# Patient Record
Sex: Female | Born: 1960 | Race: White | Hispanic: No | Marital: Single | Smoking: Former smoker
Health system: Southern US, Community
[De-identification: ages and names within clinical notes are randomized; demographics above are authoritative.]

## PROBLEM LIST (undated history)

## (undated) DIAGNOSIS — F419 Anxiety disorder, unspecified: Secondary | ICD-10-CM

## (undated) DIAGNOSIS — K2270 Barrett's esophagus without dysplasia: Secondary | ICD-10-CM

## (undated) DIAGNOSIS — K219 Gastro-esophageal reflux disease without esophagitis: Secondary | ICD-10-CM

## (undated) DIAGNOSIS — R002 Palpitations: Secondary | ICD-10-CM

## (undated) DIAGNOSIS — K648 Other hemorrhoids: Secondary | ICD-10-CM

## (undated) DIAGNOSIS — K222 Esophageal obstruction: Secondary | ICD-10-CM

## (undated) DIAGNOSIS — C50919 Malignant neoplasm of unspecified site of unspecified female breast: Secondary | ICD-10-CM

## (undated) DIAGNOSIS — Z923 Personal history of irradiation: Secondary | ICD-10-CM

## (undated) DIAGNOSIS — E785 Hyperlipidemia, unspecified: Secondary | ICD-10-CM

## (undated) DIAGNOSIS — K589 Irritable bowel syndrome without diarrhea: Secondary | ICD-10-CM

## (undated) DIAGNOSIS — R0789 Other chest pain: Secondary | ICD-10-CM

## (undated) DIAGNOSIS — M79602 Pain in left arm: Secondary | ICD-10-CM

## (undated) HISTORY — PX: ESOPHAGOGASTRODUODENOSCOPY: SHX1529

## (undated) HISTORY — DX: Other chest pain: R07.89

## (undated) HISTORY — DX: Barrett's esophagus without dysplasia: K22.70

## (undated) HISTORY — DX: Hyperlipidemia, unspecified: E78.5

## (undated) HISTORY — DX: Anxiety disorder, unspecified: F41.9

## (undated) HISTORY — DX: Esophageal obstruction: K22.2

## (undated) HISTORY — DX: Other hemorrhoids: K64.8

## (undated) HISTORY — DX: Irritable bowel syndrome, unspecified: K58.9

## (undated) HISTORY — DX: Gastro-esophageal reflux disease without esophagitis: K21.9

## (undated) HISTORY — DX: Malignant neoplasm of unspecified site of unspecified female breast: C50.919

## (undated) HISTORY — DX: Pain in left arm: M79.602

## (undated) HISTORY — PX: OVARIAN CYST REMOVAL: SHX89

## (undated) HISTORY — DX: Palpitations: R00.2

---

## 2000-04-12 ENCOUNTER — Other Ambulatory Visit: Admission: RE | Admit: 2000-04-12 | Discharge: 2000-04-12 | Payer: Self-pay | Admitting: Obstetrics and Gynecology

## 2000-04-14 ENCOUNTER — Encounter: Payer: Self-pay | Admitting: Obstetrics and Gynecology

## 2000-04-14 ENCOUNTER — Encounter: Admission: RE | Admit: 2000-04-14 | Discharge: 2000-04-14 | Payer: Self-pay | Admitting: Obstetrics and Gynecology

## 2001-05-04 ENCOUNTER — Other Ambulatory Visit: Admission: RE | Admit: 2001-05-04 | Discharge: 2001-05-04 | Payer: Self-pay | Admitting: Obstetrics and Gynecology

## 2001-09-08 ENCOUNTER — Encounter: Admission: RE | Admit: 2001-09-08 | Discharge: 2001-09-08 | Payer: Self-pay | Admitting: Family Medicine

## 2001-09-08 ENCOUNTER — Encounter: Payer: Self-pay | Admitting: Family Medicine

## 2002-05-17 ENCOUNTER — Other Ambulatory Visit: Admission: RE | Admit: 2002-05-17 | Discharge: 2002-05-17 | Payer: Self-pay | Admitting: Obstetrics and Gynecology

## 2003-03-28 ENCOUNTER — Encounter: Admission: RE | Admit: 2003-03-28 | Discharge: 2003-03-28 | Payer: Self-pay | Admitting: Obstetrics and Gynecology

## 2003-03-28 ENCOUNTER — Encounter: Payer: Self-pay | Admitting: Obstetrics and Gynecology

## 2003-05-24 ENCOUNTER — Other Ambulatory Visit: Admission: RE | Admit: 2003-05-24 | Discharge: 2003-05-24 | Payer: Self-pay | Admitting: Obstetrics and Gynecology

## 2004-01-10 HISTORY — PX: COLONOSCOPY: SHX174

## 2004-01-27 ENCOUNTER — Emergency Department (HOSPITAL_COMMUNITY): Admission: EM | Admit: 2004-01-27 | Discharge: 2004-01-27 | Payer: Self-pay | Admitting: Emergency Medicine

## 2004-02-03 ENCOUNTER — Ambulatory Visit (HOSPITAL_COMMUNITY): Admission: RE | Admit: 2004-02-03 | Discharge: 2004-02-03 | Payer: Self-pay | Admitting: Internal Medicine

## 2004-02-03 ENCOUNTER — Encounter (INDEPENDENT_AMBULATORY_CARE_PROVIDER_SITE_OTHER): Payer: Self-pay | Admitting: *Deleted

## 2004-04-29 ENCOUNTER — Encounter: Admission: RE | Admit: 2004-04-29 | Discharge: 2004-04-29 | Payer: Self-pay | Admitting: Obstetrics and Gynecology

## 2004-07-16 ENCOUNTER — Other Ambulatory Visit: Admission: RE | Admit: 2004-07-16 | Discharge: 2004-07-16 | Payer: Self-pay | Admitting: Obstetrics and Gynecology

## 2005-01-05 ENCOUNTER — Ambulatory Visit: Payer: Self-pay | Admitting: Internal Medicine

## 2005-01-28 ENCOUNTER — Ambulatory Visit: Payer: Self-pay | Admitting: Psychology

## 2005-02-02 ENCOUNTER — Ambulatory Visit: Payer: Self-pay | Admitting: Internal Medicine

## 2005-02-02 ENCOUNTER — Encounter (INDEPENDENT_AMBULATORY_CARE_PROVIDER_SITE_OTHER): Payer: Self-pay | Admitting: *Deleted

## 2005-02-09 ENCOUNTER — Ambulatory Visit: Payer: Self-pay | Admitting: Psychology

## 2005-02-25 ENCOUNTER — Ambulatory Visit: Payer: Self-pay | Admitting: Psychology

## 2005-03-09 ENCOUNTER — Ambulatory Visit: Payer: Self-pay | Admitting: Psychology

## 2005-03-09 ENCOUNTER — Ambulatory Visit: Payer: Self-pay | Admitting: Internal Medicine

## 2005-03-17 ENCOUNTER — Ambulatory Visit: Payer: Self-pay | Admitting: Psychology

## 2005-03-25 ENCOUNTER — Ambulatory Visit: Payer: Self-pay | Admitting: Psychology

## 2005-04-02 ENCOUNTER — Ambulatory Visit: Payer: Self-pay | Admitting: Internal Medicine

## 2005-04-07 ENCOUNTER — Ambulatory Visit: Payer: Self-pay | Admitting: Internal Medicine

## 2005-05-07 ENCOUNTER — Ambulatory Visit: Payer: Self-pay | Admitting: Psychology

## 2005-05-17 ENCOUNTER — Ambulatory Visit: Payer: Self-pay | Admitting: Psychology

## 2005-06-09 ENCOUNTER — Ambulatory Visit: Payer: Self-pay | Admitting: Psychology

## 2005-07-12 HISTORY — PX: BREAST LUMPECTOMY: SHX2

## 2005-07-12 HISTORY — PX: PORTACATH PLACEMENT: SHX2246

## 2005-08-02 ENCOUNTER — Other Ambulatory Visit: Admission: RE | Admit: 2005-08-02 | Discharge: 2005-08-02 | Payer: Self-pay | Admitting: Obstetrics and Gynecology

## 2005-08-06 ENCOUNTER — Ambulatory Visit: Payer: Self-pay | Admitting: Psychology

## 2005-10-11 ENCOUNTER — Ambulatory Visit: Payer: Self-pay | Admitting: Psychology

## 2005-10-27 ENCOUNTER — Ambulatory Visit: Payer: Self-pay | Admitting: Psychology

## 2005-11-25 ENCOUNTER — Ambulatory Visit: Payer: Self-pay | Admitting: Psychology

## 2005-12-14 ENCOUNTER — Ambulatory Visit: Payer: Self-pay | Admitting: Psychology

## 2005-12-27 ENCOUNTER — Ambulatory Visit: Payer: Self-pay | Admitting: Internal Medicine

## 2006-01-17 ENCOUNTER — Ambulatory Visit: Payer: Self-pay | Admitting: Psychology

## 2006-02-09 ENCOUNTER — Ambulatory Visit: Payer: Self-pay | Admitting: Psychology

## 2006-02-14 ENCOUNTER — Ambulatory Visit: Payer: Self-pay | Admitting: Psychology

## 2006-02-28 ENCOUNTER — Ambulatory Visit: Payer: Self-pay | Admitting: Psychology

## 2006-03-28 ENCOUNTER — Ambulatory Visit: Payer: Self-pay | Admitting: Psychology

## 2006-04-06 ENCOUNTER — Encounter: Admission: RE | Admit: 2006-04-06 | Discharge: 2006-04-06 | Payer: Self-pay | Admitting: Obstetrics and Gynecology

## 2006-04-06 ENCOUNTER — Encounter (INDEPENDENT_AMBULATORY_CARE_PROVIDER_SITE_OTHER): Payer: Self-pay | Admitting: *Deleted

## 2006-04-06 ENCOUNTER — Encounter (INDEPENDENT_AMBULATORY_CARE_PROVIDER_SITE_OTHER): Payer: Self-pay | Admitting: Diagnostic Radiology

## 2006-04-12 ENCOUNTER — Ambulatory Visit: Payer: Self-pay | Admitting: Psychology

## 2006-04-18 ENCOUNTER — Encounter: Admission: RE | Admit: 2006-04-18 | Discharge: 2006-04-18 | Payer: Self-pay | Admitting: General Surgery

## 2006-04-19 ENCOUNTER — Encounter: Admission: RE | Admit: 2006-04-19 | Discharge: 2006-04-19 | Payer: Self-pay | Admitting: General Surgery

## 2006-04-22 ENCOUNTER — Encounter (INDEPENDENT_AMBULATORY_CARE_PROVIDER_SITE_OTHER): Payer: Self-pay | Admitting: *Deleted

## 2006-04-22 ENCOUNTER — Ambulatory Visit (HOSPITAL_BASED_OUTPATIENT_CLINIC_OR_DEPARTMENT_OTHER): Admission: RE | Admit: 2006-04-22 | Discharge: 2006-04-22 | Payer: Self-pay | Admitting: General Surgery

## 2006-04-22 ENCOUNTER — Encounter: Admission: RE | Admit: 2006-04-22 | Discharge: 2006-04-22 | Payer: Self-pay | Admitting: General Surgery

## 2006-04-22 ENCOUNTER — Ambulatory Visit (HOSPITAL_COMMUNITY): Admission: RE | Admit: 2006-04-22 | Discharge: 2006-04-22 | Payer: Self-pay | Admitting: General Surgery

## 2006-04-25 ENCOUNTER — Ambulatory Visit: Payer: Self-pay | Admitting: Oncology

## 2006-04-29 ENCOUNTER — Ambulatory Visit: Payer: Self-pay | Admitting: Psychology

## 2006-05-12 ENCOUNTER — Ambulatory Visit (HOSPITAL_COMMUNITY): Admission: RE | Admit: 2006-05-12 | Discharge: 2006-05-12 | Payer: Self-pay | Admitting: Oncology

## 2006-05-18 ENCOUNTER — Ambulatory Visit (HOSPITAL_COMMUNITY): Admission: RE | Admit: 2006-05-18 | Discharge: 2006-05-18 | Payer: Self-pay | Admitting: General Surgery

## 2006-05-24 ENCOUNTER — Ambulatory Visit: Admission: RE | Admit: 2006-05-24 | Discharge: 2006-06-21 | Payer: Self-pay | Admitting: Radiation Oncology

## 2006-05-25 ENCOUNTER — Emergency Department (HOSPITAL_COMMUNITY): Admission: EM | Admit: 2006-05-25 | Discharge: 2006-05-26 | Payer: Self-pay | Admitting: Emergency Medicine

## 2006-05-26 ENCOUNTER — Ambulatory Visit: Payer: Self-pay | Admitting: Oncology

## 2006-05-26 LAB — CBC WITH DIFFERENTIAL/PLATELET
BASO%: 0.3 % (ref 0.0–2.0)
HCT: 35.3 % (ref 34.8–46.6)
MCHC: 33 g/dL (ref 32.0–36.0)
MONO#: 1.8 10*3/uL — ABNORMAL HIGH (ref 0.1–0.9)
RBC: 4.5 10*6/uL (ref 3.70–5.32)
RDW: 12.9 % (ref 11.3–14.5)
WBC: 15 10*3/uL — ABNORMAL HIGH (ref 3.9–10.0)
lymph#: 2.2 10*3/uL (ref 0.9–3.3)

## 2006-05-26 LAB — COMPREHENSIVE METABOLIC PANEL
ALT: 132 U/L — ABNORMAL HIGH (ref 0–35)
CO2: 28 mEq/L (ref 19–32)
Calcium: 9.3 mg/dL (ref 8.4–10.5)
Chloride: 102 mEq/L (ref 96–112)
Potassium: 4.2 mEq/L (ref 3.5–5.3)
Sodium: 138 mEq/L (ref 135–145)
Total Protein: 6.7 g/dL (ref 6.0–8.3)

## 2006-05-30 LAB — CLOSTRIDIUM DIFFICILE EIA

## 2006-05-31 LAB — URINALYSIS, MICROSCOPIC - CHCC
Bilirubin (Urine): NEGATIVE
Ketones: NEGATIVE mg/dL
Leukocyte Esterase: NEGATIVE
Protein: NEGATIVE mg/dL
RBC count: NEGATIVE (ref 0–2)
WBC, UA: NEGATIVE (ref 0–2)

## 2006-05-31 LAB — CBC WITH DIFFERENTIAL/PLATELET
BASO%: 0.7 % (ref 0.0–2.0)
EOS%: 0.5 % (ref 0.0–7.0)
MCH: 26 pg (ref 26.0–34.0)
MCHC: 32.8 g/dL (ref 32.0–36.0)
MCV: 79.2 fL — ABNORMAL LOW (ref 81.0–101.0)
MONO%: 8.5 % (ref 0.0–13.0)
RDW: 12.8 % (ref 11.3–14.5)
lymph#: 2 10*3/uL (ref 0.9–3.3)

## 2006-06-02 LAB — URINE CULTURE

## 2006-06-08 ENCOUNTER — Ambulatory Visit: Payer: Self-pay | Admitting: Oncology

## 2006-06-09 LAB — CBC WITH DIFFERENTIAL/PLATELET
BASO%: 3.7 % — ABNORMAL HIGH (ref 0.0–2.0)
Basophils Absolute: 0.2 10*3/uL — ABNORMAL HIGH (ref 0.0–0.1)
Eosinophils Absolute: 0.2 10*3/uL (ref 0.0–0.5)
HCT: 36.6 % (ref 34.8–46.6)
HGB: 11.8 g/dL (ref 11.6–15.9)
LYMPH%: 30.2 % (ref 14.0–48.0)
MCHC: 32.3 g/dL (ref 32.0–36.0)
MONO#: 0.7 10*3/uL (ref 0.1–0.9)
NEUT#: 2.6 10*3/uL (ref 1.5–6.5)
NEUT%: 49.6 % (ref 39.6–76.8)
Platelets: 509 10*3/uL — ABNORMAL HIGH (ref 145–400)
WBC: 5.3 10*3/uL (ref 3.9–10.0)
lymph#: 1.6 10*3/uL (ref 0.9–3.3)

## 2006-06-09 LAB — COMPREHENSIVE METABOLIC PANEL
ALT: 39 U/L — ABNORMAL HIGH (ref 0–35)
BUN: 7 mg/dL (ref 6–23)
CO2: 28 mEq/L (ref 19–32)
Calcium: 9.7 mg/dL (ref 8.4–10.5)
Chloride: 105 mEq/L (ref 96–112)
Creatinine, Ser: 0.71 mg/dL (ref 0.40–1.20)
Total Bilirubin: 0.6 mg/dL (ref 0.3–1.2)

## 2006-06-15 ENCOUNTER — Ambulatory Visit: Payer: Self-pay | Admitting: Psychology

## 2006-06-16 LAB — COMPREHENSIVE METABOLIC PANEL
BUN: 7 mg/dL (ref 6–23)
CO2: 27 mEq/L (ref 19–32)
Creatinine, Ser: 0.77 mg/dL (ref 0.40–1.20)
Glucose, Bld: 100 mg/dL — ABNORMAL HIGH (ref 70–99)
Total Bilirubin: 0.3 mg/dL (ref 0.3–1.2)

## 2006-06-16 LAB — CBC WITH DIFFERENTIAL/PLATELET
BASO%: 0.7 % (ref 0.0–2.0)
Eosinophils Absolute: 0.4 10*3/uL (ref 0.0–0.5)
HCT: 34.4 % — ABNORMAL LOW (ref 34.8–46.6)
LYMPH%: 19.2 % (ref 14.0–48.0)
MCHC: 33.3 g/dL (ref 32.0–36.0)
MONO#: 1.7 10*3/uL — ABNORMAL HIGH (ref 0.1–0.9)
NEUT%: 64.2 % (ref 39.6–76.8)
Platelets: 348 10*3/uL (ref 145–400)
WBC: 13.2 10*3/uL — ABNORMAL HIGH (ref 3.9–10.0)

## 2006-06-30 LAB — CBC WITH DIFFERENTIAL/PLATELET
Eosinophils Absolute: 0.1 10*3/uL (ref 0.0–0.5)
HCT: 32.4 % — ABNORMAL LOW (ref 34.8–46.6)
LYMPH%: 24.9 % (ref 14.0–48.0)
MONO#: 0.6 10*3/uL (ref 0.1–0.9)
NEUT#: 3 10*3/uL (ref 1.5–6.5)
NEUT%: 60.9 % (ref 39.6–76.8)
Platelets: 377 10*3/uL (ref 145–400)
WBC: 5 10*3/uL (ref 3.9–10.0)

## 2006-06-30 LAB — COMPREHENSIVE METABOLIC PANEL
CO2: 30 mEq/L (ref 19–32)
Calcium: 9.7 mg/dL (ref 8.4–10.5)
Chloride: 102 mEq/L (ref 96–112)
Creatinine, Ser: 0.64 mg/dL (ref 0.40–1.20)
Glucose, Bld: 104 mg/dL — ABNORMAL HIGH (ref 70–99)
Sodium: 139 mEq/L (ref 135–145)
Total Bilirubin: 0.5 mg/dL (ref 0.3–1.2)
Total Protein: 6.5 g/dL (ref 6.0–8.3)

## 2006-07-07 LAB — CBC WITH DIFFERENTIAL/PLATELET
BASO%: 1.1 % (ref 0.0–2.0)
LYMPH%: 20.4 % (ref 14.0–48.0)
MCHC: 32.5 g/dL (ref 32.0–36.0)
MONO#: 1.1 10*3/uL — ABNORMAL HIGH (ref 0.1–0.9)
MONO%: 12.7 % (ref 0.0–13.0)
Platelets: 300 10*3/uL (ref 145–400)
RBC: 4.06 10*6/uL (ref 3.70–5.32)
WBC: 8.9 10*3/uL (ref 3.9–10.0)

## 2006-07-19 ENCOUNTER — Ambulatory Visit: Payer: Self-pay | Admitting: Oncology

## 2006-07-20 ENCOUNTER — Ambulatory Visit: Payer: Self-pay | Admitting: Psychology

## 2006-07-21 LAB — CBC WITH DIFFERENTIAL/PLATELET
BASO%: 2.4 % — ABNORMAL HIGH (ref 0.0–2.0)
Basophils Absolute: 0.2 10*3/uL — ABNORMAL HIGH (ref 0.0–0.1)
EOS%: 2.4 % (ref 0.0–7.0)
HCT: 34.3 % — ABNORMAL LOW (ref 34.8–46.6)
HGB: 11.2 g/dL — ABNORMAL LOW (ref 11.6–15.9)
LYMPH%: 24.1 % (ref 14.0–48.0)
MCH: 25.4 pg — ABNORMAL LOW (ref 26.0–34.0)
MCHC: 32.6 g/dL (ref 32.0–36.0)
MCV: 78.1 fL — ABNORMAL LOW (ref 81.0–101.0)
NEUT%: 57.8 % (ref 39.6–76.8)
Platelets: 314 10*3/uL (ref 145–400)
lymph#: 1.6 10*3/uL (ref 0.9–3.3)

## 2006-07-27 ENCOUNTER — Ambulatory Visit: Admission: RE | Admit: 2006-07-27 | Discharge: 2006-10-12 | Payer: Self-pay | Admitting: Oncology

## 2006-07-27 LAB — CBC WITH DIFFERENTIAL/PLATELET
BASO%: 2.4 % — ABNORMAL HIGH (ref 0.0–2.0)
HCT: 30.8 % — ABNORMAL LOW (ref 34.8–46.6)
MCHC: 33 g/dL (ref 32.0–36.0)
MONO#: 0.1 10*3/uL (ref 0.1–0.9)
RBC: 3.91 10*6/uL (ref 3.70–5.32)
WBC: 1.6 10*3/uL — ABNORMAL LOW (ref 3.9–10.0)
lymph#: 0.7 10*3/uL — ABNORMAL LOW (ref 0.9–3.3)

## 2006-08-30 ENCOUNTER — Ambulatory Visit: Payer: Self-pay | Admitting: Oncology

## 2006-09-14 LAB — COMPREHENSIVE METABOLIC PANEL
ALT: 20 U/L (ref 0–35)
Albumin: 4.3 g/dL (ref 3.5–5.2)
BUN: 8 mg/dL (ref 6–23)
CO2: 25 mEq/L (ref 19–32)
Calcium: 9.8 mg/dL (ref 8.4–10.5)
Chloride: 106 mEq/L (ref 96–112)
Creatinine, Ser: 0.72 mg/dL (ref 0.40–1.20)

## 2006-09-14 LAB — CBC WITH DIFFERENTIAL/PLATELET
BASO%: 1.2 % (ref 0.0–2.0)
Basophils Absolute: 0 10*3/uL (ref 0.0–0.1)
HCT: 34 % — ABNORMAL LOW (ref 34.8–46.6)
HGB: 11.5 g/dL — ABNORMAL LOW (ref 11.6–15.9)
MONO#: 0.6 10*3/uL (ref 0.1–0.9)
NEUT%: 57.4 % (ref 39.6–76.8)
WBC: 3.8 10*3/uL — ABNORMAL LOW (ref 3.9–10.0)
lymph#: 0.4 10*3/uL — ABNORMAL LOW (ref 0.9–3.3)

## 2006-09-14 LAB — CANCER ANTIGEN 27.29: CA 27.29: 11 U/mL (ref 0–39)

## 2006-09-14 LAB — FOLLICLE STIMULATING HORMONE: FSH: 99.7 m[IU]/mL

## 2006-09-15 ENCOUNTER — Ambulatory Visit: Payer: Self-pay | Admitting: Internal Medicine

## 2006-09-26 LAB — ESTRADIOL, ULTRA SENS: Estradiol, Ultra Sensitive: <2 pg/mL

## 2006-10-05 ENCOUNTER — Ambulatory Visit (HOSPITAL_BASED_OUTPATIENT_CLINIC_OR_DEPARTMENT_OTHER): Admission: RE | Admit: 2006-10-05 | Discharge: 2006-10-05 | Payer: Self-pay | Admitting: General Surgery

## 2006-10-21 ENCOUNTER — Ambulatory Visit: Payer: Self-pay | Admitting: Oncology

## 2006-11-01 ENCOUNTER — Ambulatory Visit: Payer: Self-pay | Admitting: Psychology

## 2006-12-02 ENCOUNTER — Ambulatory Visit: Payer: Self-pay | Admitting: Oncology

## 2006-12-07 LAB — CBC & DIFF AND RETIC
BASO%: 1.4 % (ref 0.0–2.0)
EOS%: 8.8 % — ABNORMAL HIGH (ref 0.0–7.0)
MCH: 25 pg — ABNORMAL LOW (ref 26.0–34.0)
MCV: 74.2 fL — ABNORMAL LOW (ref 81.0–101.0)
MONO%: 11.6 % (ref 0.0–13.0)
RBC: 4.52 10*6/uL (ref 3.70–5.32)
RDW: 14.2 % (ref 11.3–14.5)
RETIC #: 51.5 10*3/uL (ref 19.7–115.1)
Retic %: 1.1 % (ref 0.4–2.3)

## 2006-12-09 LAB — COMPREHENSIVE METABOLIC PANEL
AST: 15 U/L (ref 0–37)
Alkaline Phosphatase: 45 U/L (ref 39–117)
BUN: 10 mg/dL (ref 6–23)
Calcium: 9.1 mg/dL (ref 8.4–10.5)
Creatinine, Ser: 0.71 mg/dL (ref 0.40–1.20)
Total Bilirubin: 0.3 mg/dL (ref 0.3–1.2)

## 2006-12-09 LAB — HEMOGLOBINOPATHY EVALUATION
Hgb A2 Quant: 2.2 % (ref 2.2–3.2)
Hgb A: 97.8 % (ref 96.8–97.8)

## 2006-12-09 LAB — VITAMIN B12: Vitamin B-12: 460 pg/mL (ref 211–911)

## 2006-12-09 LAB — FOLATE: Folate: 14.4 ng/mL

## 2007-01-11 ENCOUNTER — Ambulatory Visit: Payer: Self-pay | Admitting: Internal Medicine

## 2007-01-16 ENCOUNTER — Ambulatory Visit: Payer: Self-pay | Admitting: Psychology

## 2007-01-19 ENCOUNTER — Encounter: Payer: Self-pay | Admitting: Internal Medicine

## 2007-01-19 ENCOUNTER — Ambulatory Visit: Payer: Self-pay

## 2007-02-23 ENCOUNTER — Ambulatory Visit: Payer: Self-pay | Admitting: Internal Medicine

## 2007-03-07 ENCOUNTER — Encounter: Payer: Self-pay | Admitting: Internal Medicine

## 2007-03-07 ENCOUNTER — Ambulatory Visit: Payer: Self-pay | Admitting: Internal Medicine

## 2007-04-04 ENCOUNTER — Ambulatory Visit: Payer: Self-pay | Admitting: Oncology

## 2007-04-10 ENCOUNTER — Encounter: Admission: RE | Admit: 2007-04-10 | Discharge: 2007-04-10 | Payer: Self-pay | Admitting: Oncology

## 2007-04-12 ENCOUNTER — Encounter: Payer: Self-pay | Admitting: Internal Medicine

## 2007-04-12 LAB — CBC WITH DIFFERENTIAL/PLATELET
BASO%: 0.9 % (ref 0.0–2.0)
EOS%: 7.1 % — ABNORMAL HIGH (ref 0.0–7.0)
LYMPH%: 28.7 % (ref 14.0–48.0)
MCH: 26.4 pg (ref 26.0–34.0)
MCHC: 34 g/dL (ref 32.0–36.0)
MCV: 77.6 fL — ABNORMAL LOW (ref 81.0–101.0)
MONO%: 9 % (ref 0.0–13.0)
Platelets: 242 10*3/uL (ref 145–400)
RBC: 4.48 10*6/uL (ref 3.70–5.32)
RDW: 13.1 % (ref 11.3–14.5)

## 2007-04-18 LAB — COMPREHENSIVE METABOLIC PANEL
ALT: 18 U/L (ref 0–35)
AST: 22 U/L (ref 0–37)
Alkaline Phosphatase: 53 U/L (ref 39–117)
Glucose, Bld: 88 mg/dL (ref 70–99)
Potassium: 4.2 mEq/L (ref 3.5–5.3)
Sodium: 141 mEq/L (ref 135–145)
Total Bilirubin: 0.4 mg/dL (ref 0.3–1.2)
Total Protein: 7.3 g/dL (ref 6.0–8.3)

## 2007-04-18 LAB — FOLLICLE STIMULATING HORMONE: FSH: 70 m[IU]/mL

## 2007-04-19 LAB — ESTRADIOL, ULTRA SENS: Estradiol, Ultra Sensitive: 27 pg/mL

## 2007-05-05 ENCOUNTER — Ambulatory Visit: Payer: Self-pay | Admitting: Psychology

## 2007-05-08 DIAGNOSIS — K648 Other hemorrhoids: Secondary | ICD-10-CM | POA: Insufficient documentation

## 2007-05-08 DIAGNOSIS — Z853 Personal history of malignant neoplasm of breast: Secondary | ICD-10-CM | POA: Insufficient documentation

## 2007-05-08 DIAGNOSIS — E785 Hyperlipidemia, unspecified: Secondary | ICD-10-CM | POA: Insufficient documentation

## 2007-05-08 DIAGNOSIS — K2270 Barrett's esophagus without dysplasia: Secondary | ICD-10-CM | POA: Insufficient documentation

## 2007-05-08 DIAGNOSIS — Z8719 Personal history of other diseases of the digestive system: Secondary | ICD-10-CM | POA: Insufficient documentation

## 2007-05-08 DIAGNOSIS — K589 Irritable bowel syndrome without diarrhea: Secondary | ICD-10-CM | POA: Insufficient documentation

## 2007-05-08 DIAGNOSIS — K219 Gastro-esophageal reflux disease without esophagitis: Secondary | ICD-10-CM | POA: Insufficient documentation

## 2007-05-18 ENCOUNTER — Ambulatory Visit: Payer: Self-pay | Admitting: Psychology

## 2007-06-29 ENCOUNTER — Emergency Department (HOSPITAL_COMMUNITY): Admission: EM | Admit: 2007-06-29 | Discharge: 2007-06-29 | Payer: Self-pay | Admitting: Emergency Medicine

## 2007-07-21 ENCOUNTER — Ambulatory Visit: Payer: Self-pay | Admitting: Psychology

## 2007-07-31 ENCOUNTER — Encounter: Payer: Self-pay | Admitting: Internal Medicine

## 2007-08-02 ENCOUNTER — Ambulatory Visit: Payer: Self-pay | Admitting: Psychology

## 2007-08-14 ENCOUNTER — Ambulatory Visit: Payer: Self-pay | Admitting: Psychology

## 2007-09-06 ENCOUNTER — Ambulatory Visit: Payer: Self-pay | Admitting: Psychology

## 2007-09-29 ENCOUNTER — Ambulatory Visit: Payer: Self-pay | Admitting: Oncology

## 2007-10-11 ENCOUNTER — Encounter: Payer: Self-pay | Admitting: Internal Medicine

## 2007-10-17 ENCOUNTER — Ambulatory Visit: Payer: Self-pay | Admitting: Psychology

## 2007-11-09 ENCOUNTER — Ambulatory Visit: Payer: Self-pay | Admitting: Cardiology

## 2007-11-18 IMAGING — CT CT CHEST W/ CM
2 of 3 series · 15 of 36 positions shown, 18 images · IV contrast (omnipaque)
Comparison: No prior CT scans for correlation.

CLINICAL DATA: Breast ca.  New diagnosis.  Right lumpectomy. 
CHEST CT WITH CONTRAST:
TECHNIQUE: Multidetector CT imaging of the chest was performed following the standard protocol during bolus administration of intravenous contrast.
Contrast:  80 cc Omnipaque 300

[Series 2: chest routine 5.0 b40f · axial · 0.66mm/px · z∈[-302,-48]mm · 12 of 61 slices shown, 15 images]
[im 5/61  mediastinal]
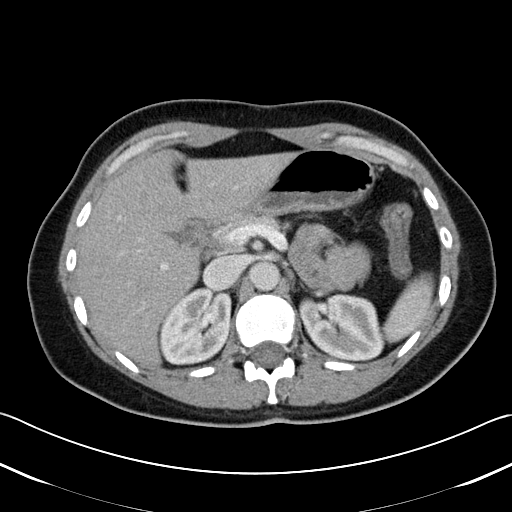
[im 5/61  lung]
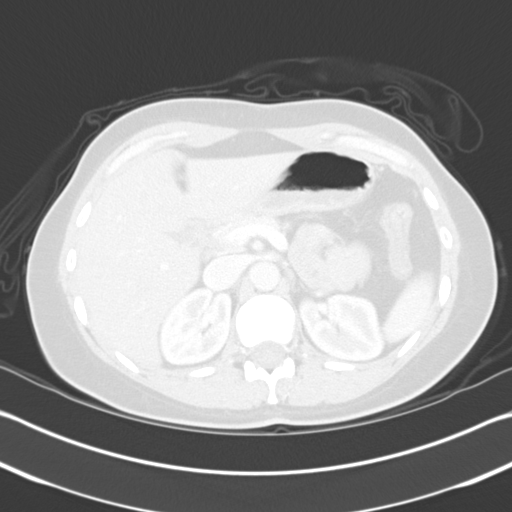
[im 9/61  lung]
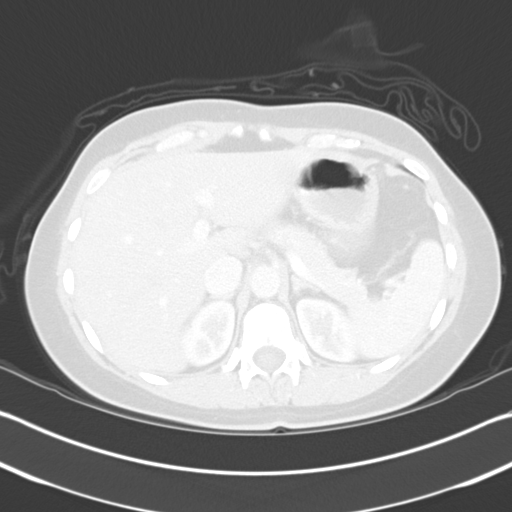
[im 14/61  lung]
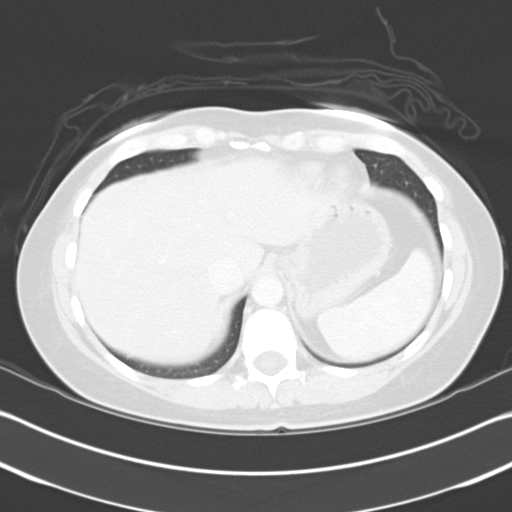
[im 18/61  lung]
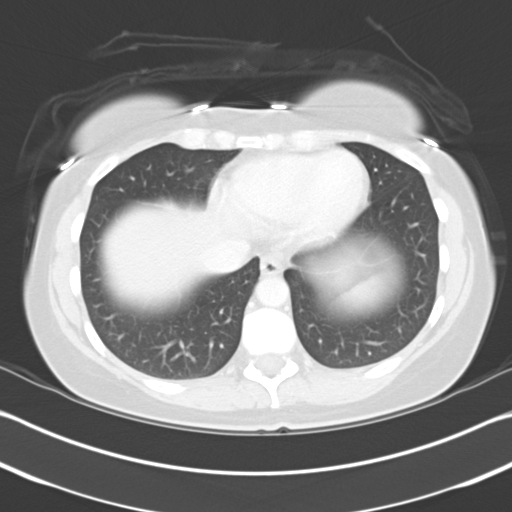
[im 23/61  mediastinal]
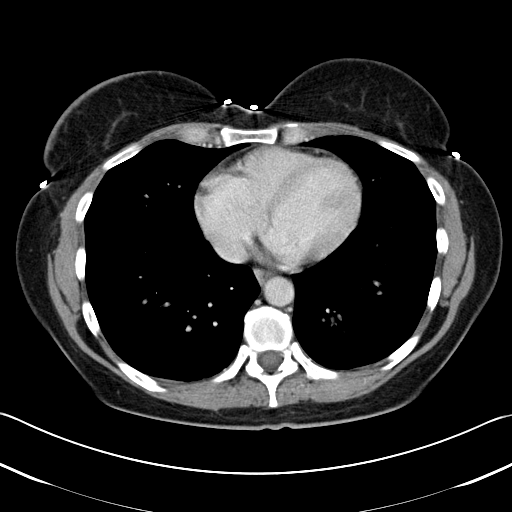
[im 23/61  lung]
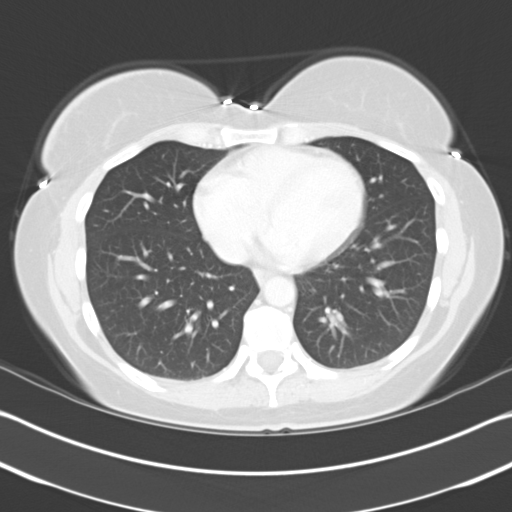
[im 27/61  lung]
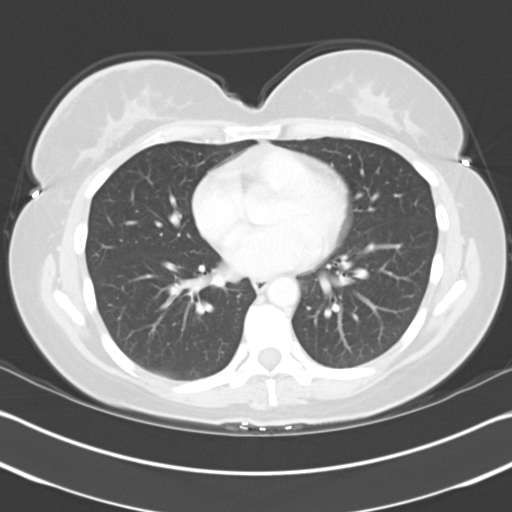
[im 34/61  lung]
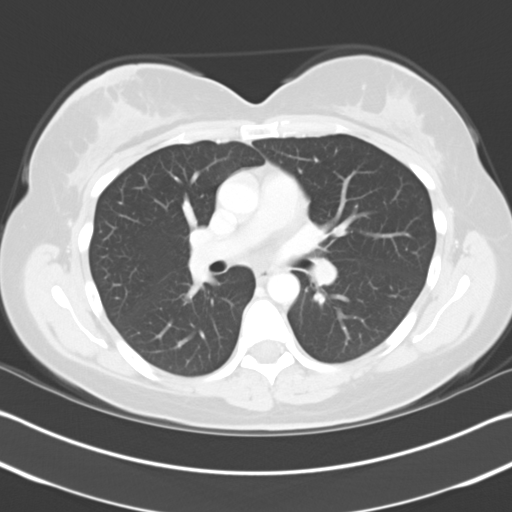
[im 38/61  lung]
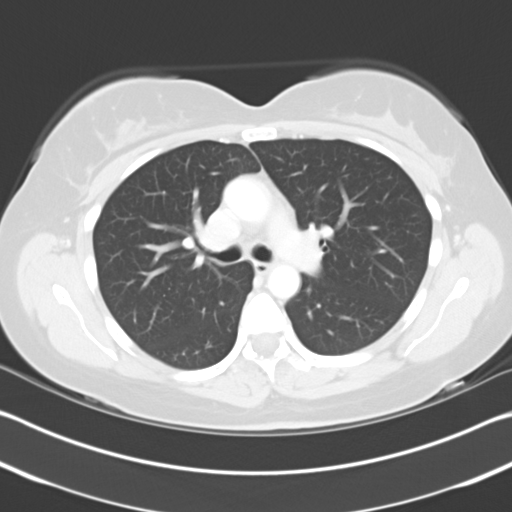
[im 43/61  mediastinal]
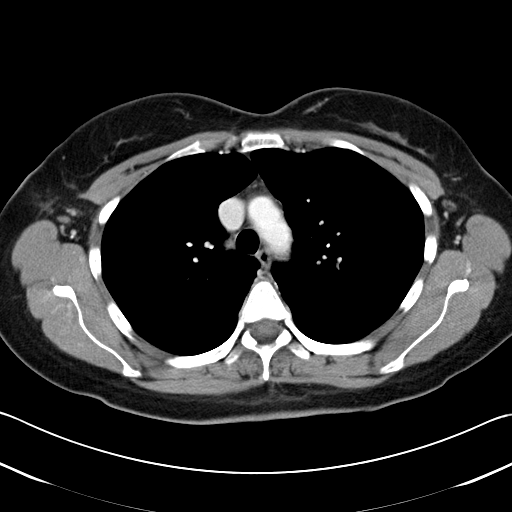
[im 43/61  lung]
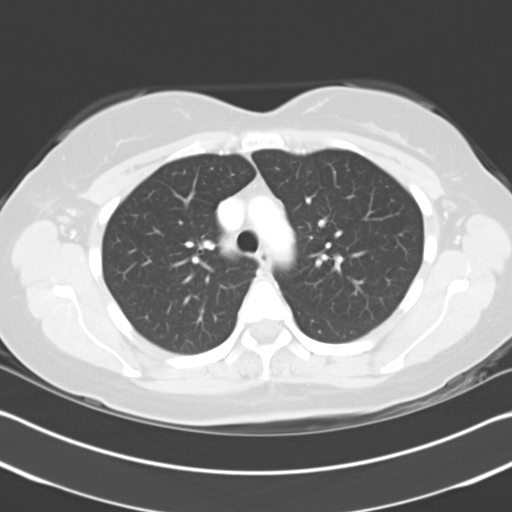
[im 47/61  lung]
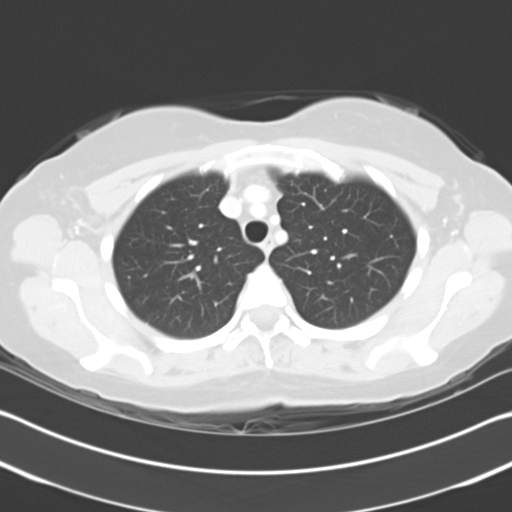
[im 52/61  lung]
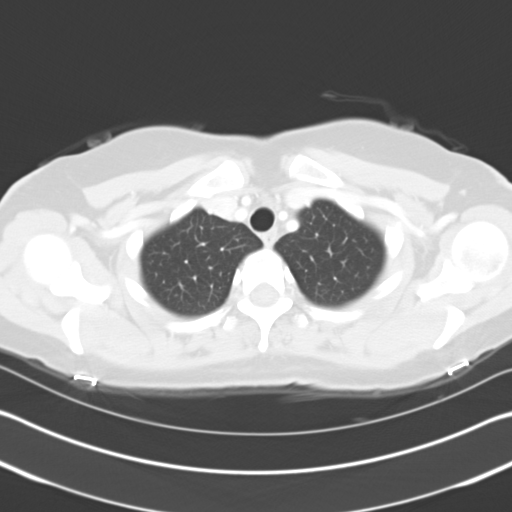
[im 56/61  lung]
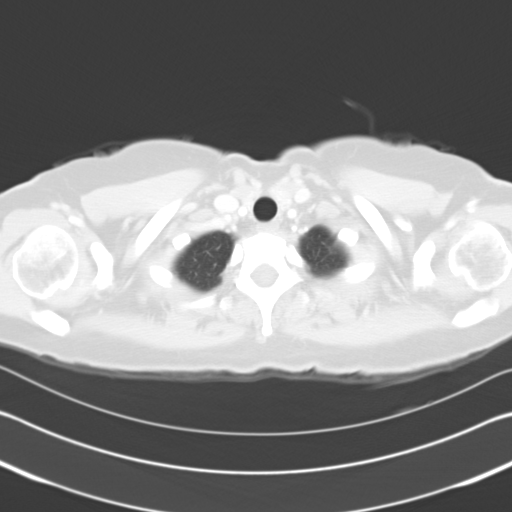

[Series 4: mpr coronal chest · coronal · 0.64mm/px · 3 of 63 slices shown]
[im 13/63  lung]
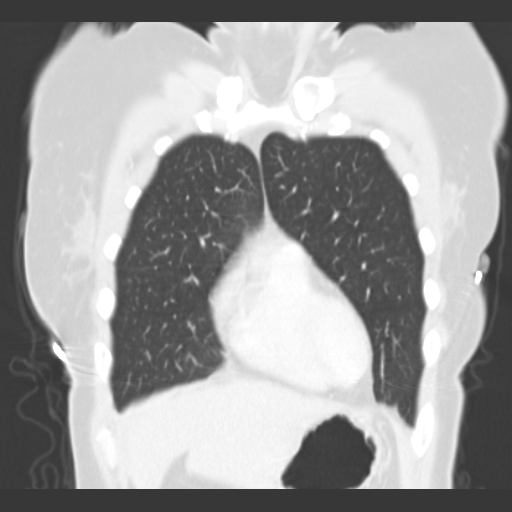
[im 25/63  lung]
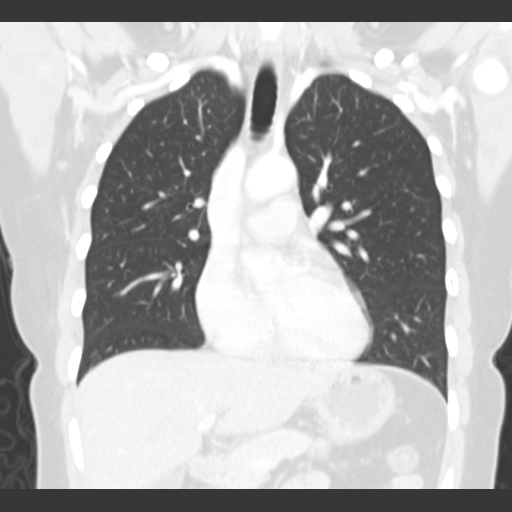
[im 38/63  lung]
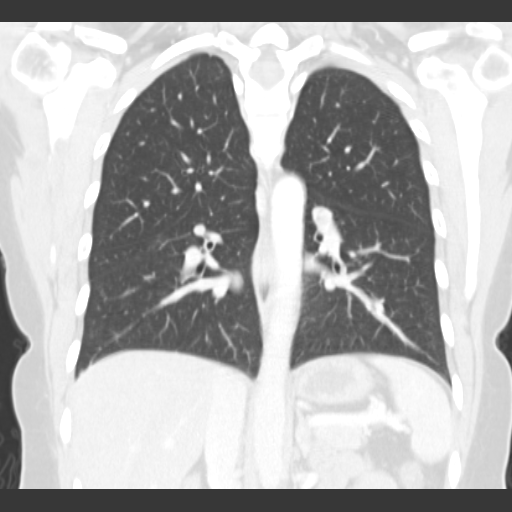

[15 of 36 positions shown; findings below may reference images not displayed]

FINDINGS: Right breast postoperative changes.  Skin thickening, particularly in the region of the areolar/periareolar tissues.  Postoperative changes right axilla with streaky and ill-defined soft tissue densities noted.  No abnormal fluid collection at the operative sites.  No pathologically enlarged mediastinal or hilar lymph nodes.   No pulmonary nodules or masses.  No pleural or pericardial effusions.  Bony thorax unremarkable.
IMPRESSION: No evidence for metastatic involvement of the chest.

## 2008-04-04 ENCOUNTER — Ambulatory Visit: Payer: Self-pay | Admitting: Oncology

## 2008-04-10 ENCOUNTER — Encounter: Admission: RE | Admit: 2008-04-10 | Discharge: 2008-04-10 | Payer: Self-pay | Admitting: Obstetrics and Gynecology

## 2008-04-11 LAB — CBC WITH DIFFERENTIAL/PLATELET
Eosinophils Absolute: 0.3 10*3/uL (ref 0.0–0.5)
HCT: 35.6 % (ref 34.8–46.6)
HGB: 11.9 g/dL (ref 11.6–15.9)
LYMPH%: 29.1 % (ref 14.0–48.0)
MCV: 80.5 fL — ABNORMAL LOW (ref 81.0–101.0)
MONO#: 0.5 10*3/uL (ref 0.1–0.9)
NEUT#: 2.2 10*3/uL (ref 1.5–6.5)
NEUT%: 51.2 % (ref 39.6–76.8)
Platelets: 237 10*3/uL (ref 145–400)
RBC: 4.43 10*6/uL (ref 3.70–5.32)
WBC: 4.2 10*3/uL (ref 3.9–10.0)
lymph#: 1.2 10*3/uL (ref 0.9–3.3)

## 2008-04-12 LAB — COMPREHENSIVE METABOLIC PANEL
CO2: 26 mEq/L (ref 19–32)
Creatinine, Ser: 0.73 mg/dL (ref 0.40–1.20)
Glucose, Bld: 97 mg/dL (ref 70–99)
Sodium: 138 mEq/L (ref 135–145)
Total Bilirubin: 0.3 mg/dL (ref 0.3–1.2)
Total Protein: 6.8 g/dL (ref 6.0–8.3)

## 2008-04-12 LAB — FOLLICLE STIMULATING HORMONE: FSH: 47.1 m[IU]/mL

## 2008-04-12 LAB — CANCER ANTIGEN 27.29: CA 27.29: 9 U/mL (ref 0–39)

## 2008-04-17 ENCOUNTER — Encounter: Payer: Self-pay | Admitting: Internal Medicine

## 2008-10-09 ENCOUNTER — Ambulatory Visit: Payer: Self-pay | Admitting: Oncology

## 2008-10-14 LAB — CBC WITH DIFFERENTIAL/PLATELET
BASO%: 0.9 % (ref 0.0–2.0)
EOS%: 5.5 % (ref 0.0–7.0)
HCT: 36.9 % (ref 34.8–46.6)
LYMPH%: 30 % (ref 14.0–49.7)
MCH: 26.4 pg (ref 25.1–34.0)
MCHC: 32.9 g/dL (ref 31.5–36.0)
MCV: 80 fL (ref 79.5–101.0)
MONO%: 9.4 % (ref 0.0–14.0)
NEUT%: 54.2 % (ref 38.4–76.8)
Platelets: 247 10*3/uL (ref 145–400)

## 2008-10-14 LAB — COMPREHENSIVE METABOLIC PANEL
Albumin: 4.2 g/dL (ref 3.5–5.2)
Alkaline Phosphatase: 56 U/L (ref 39–117)
CO2: 28 mEq/L (ref 19–32)
Calcium: 9.4 mg/dL (ref 8.4–10.5)
Total Protein: 7.3 g/dL (ref 6.0–8.3)

## 2008-10-14 LAB — FOLLICLE STIMULATING HORMONE: FSH: 83.6 m[IU]/mL

## 2008-10-21 ENCOUNTER — Encounter: Payer: Self-pay | Admitting: Internal Medicine

## 2008-10-22 LAB — ESTRADIOL, ULTRA SENS

## 2008-10-28 ENCOUNTER — Ambulatory Visit: Payer: Self-pay | Admitting: Psychiatry

## 2008-11-06 ENCOUNTER — Ambulatory Visit: Payer: Self-pay | Admitting: Psychiatry

## 2008-11-20 ENCOUNTER — Ambulatory Visit: Payer: Self-pay | Admitting: Psychiatry

## 2008-11-28 ENCOUNTER — Ambulatory Visit: Payer: Self-pay | Admitting: Psychiatry

## 2009-04-11 ENCOUNTER — Encounter: Admission: RE | Admit: 2009-04-11 | Discharge: 2009-04-11 | Payer: Self-pay | Admitting: Obstetrics and Gynecology

## 2009-04-25 ENCOUNTER — Ambulatory Visit: Payer: Self-pay | Admitting: Oncology

## 2009-04-29 LAB — CBC WITH DIFFERENTIAL/PLATELET
Basophils Absolute: 0 10*3/uL (ref 0.0–0.1)
Eosinophils Absolute: 0.3 10*3/uL (ref 0.0–0.5)
HCT: 38.7 % (ref 34.8–46.6)
HGB: 13 g/dL (ref 11.6–15.9)
LYMPH%: 33.5 % (ref 14.0–49.7)
MCV: 80 fL (ref 79.5–101.0)
MONO%: 8.7 % (ref 0.0–14.0)
NEUT#: 2.4 10*3/uL (ref 1.5–6.5)
NEUT%: 50.2 % (ref 38.4–76.8)
Platelets: 282 10*3/uL (ref 145–400)

## 2009-04-29 LAB — FOLLICLE STIMULATING HORMONE: FSH: 60 m[IU]/mL

## 2009-04-29 LAB — COMPREHENSIVE METABOLIC PANEL
Albumin: 4.3 g/dL (ref 3.5–5.2)
Alkaline Phosphatase: 46 U/L (ref 39–117)
BUN: 7 mg/dL (ref 6–23)
Glucose, Bld: 97 mg/dL (ref 70–99)
Total Bilirubin: 0.5 mg/dL (ref 0.3–1.2)

## 2009-05-06 ENCOUNTER — Encounter: Payer: Self-pay | Admitting: Internal Medicine

## 2009-05-12 LAB — ESTRADIOL, ULTRA SENS: Estradiol, Ultra Sensitive: 90

## 2009-09-08 ENCOUNTER — Encounter (INDEPENDENT_AMBULATORY_CARE_PROVIDER_SITE_OTHER): Payer: Self-pay | Admitting: *Deleted

## 2010-04-13 ENCOUNTER — Encounter: Admission: RE | Admit: 2010-04-13 | Discharge: 2010-04-13 | Payer: Self-pay | Admitting: Obstetrics and Gynecology

## 2010-04-24 ENCOUNTER — Ambulatory Visit: Payer: Self-pay | Admitting: Oncology

## 2010-04-28 LAB — COMPREHENSIVE METABOLIC PANEL
AST: 21 U/L (ref 0–37)
Alkaline Phosphatase: 54 U/L (ref 39–117)
BUN: 9 mg/dL (ref 6–23)
Creatinine, Ser: 0.89 mg/dL (ref 0.40–1.20)

## 2010-04-28 LAB — CBC WITH DIFFERENTIAL/PLATELET
Basophils Absolute: 0.1 10*3/uL (ref 0.0–0.1)
EOS%: 8.6 % — ABNORMAL HIGH (ref 0.0–7.0)
HGB: 12.1 g/dL (ref 11.6–15.9)
MCH: 26.1 pg (ref 25.1–34.0)
MCV: 81.1 fL (ref 79.5–101.0)
MONO%: 10.9 % (ref 0.0–14.0)
NEUT%: 49.8 % (ref 38.4–76.8)
RDW: 14.1 % (ref 11.2–14.5)

## 2010-05-05 ENCOUNTER — Encounter: Payer: Self-pay | Admitting: Internal Medicine

## 2010-05-06 ENCOUNTER — Ambulatory Visit: Payer: Self-pay | Admitting: Cardiology

## 2010-05-06 DIAGNOSIS — M79609 Pain in unspecified limb: Secondary | ICD-10-CM | POA: Insufficient documentation

## 2010-05-06 DIAGNOSIS — R002 Palpitations: Secondary | ICD-10-CM | POA: Insufficient documentation

## 2010-05-06 DIAGNOSIS — R0789 Other chest pain: Secondary | ICD-10-CM | POA: Insufficient documentation

## 2010-05-11 ENCOUNTER — Ambulatory Visit: Payer: Self-pay | Admitting: Genetic Counselor

## 2010-08-01 ENCOUNTER — Encounter: Payer: Self-pay | Admitting: Oncology

## 2010-08-13 NOTE — Assessment & Plan Note (Signed)
Summary: F2Y/PALPS/LEFT ARM DISCOMFORT  Medications Added ALPRAZOLAM 0.5 MG TABS (ALPRAZOLAM) 1 tab at bedtime as needed ADVIL 200 MG TABS (IBUPROFEN) 2 tabs once daily        Visit Type:  2 yr f/u Referring Provider:  Dr. Darnelle Catalan Primary Provider:  Dr. Ronne Binning  CC:  palpitations....left arm pain...pt states this is happening in the middle of the night and wakes her up..gets some sob....edema/hands/feet....pt states she has been having pain under left breast under rib cage...pt also c/o pain in the abdominal area and worries she may have an AAA....  History of Present Illness: Mrs Orson Slick returns today after being seen in the office several years ago.  She been having some palpitations particularly at night. Is associated with some atypical chest pain without radiation.  She's also had some left upper quadrant pain and is worried she may have an abdominal aortic aneurysm. There is a family history of such.  She was told years ago in [place] she had mitral valve prolapse. We performed a 2-D echocardiogram on her in 2008 which showed mitral valve thickening but no prolapse. I have not heard a click on her in the past.  When she walks or exercises she does not have palpitations or chest pain. She denies dyspnea on exertion, orthopnea, PND or edema. She's had no pain in her back or down her legs. She denies claudication.  She has very few risk factors for vascular disease. At this point, only mild hyperlipidemia though her HDL she says is good.she quit smoking in 1987.  On my last visit, recommended a stress test but she refused.  Current Medications (verified): 1)  Alprazolam 0.5 Mg Tabs (Alprazolam) .[name].. 1 Tab At Bedtime As Needed 2)  Advil 200 Mg Tabs (Ibuprofen) .... 2 Tabs Once Daily  Allergies: 1)  ! 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  She has three children.     Tobacco Use - Former.  quit 1987 Alcohol Use - no  Risk Factors: Smoking Status: quit (05/06/2010)  Review of Systems       negative other than history of present illness  Vital Signs:  Patient profile:   50 year old female Height:      64.5 inches Weight:      156.8 pounds BMI:     26.59 Pulse rate:   72 / minute Pulse rhythm:   irregular BP sitting:   102 / 66  (left arm) Cuff size:   large  Vitals Entered By: Danielle Rankin, CMA (May 06, 2010 12:56 PM)  Physical Exam  General:  Well developed, well nourished, in no acute distress. Head:  normocephalic and atraumatic Eyes:  last is otherwise normal Neck:  Neck supple, no JVD. No masses, thyromegaly or abnormal cervical nodes. Chest Burlie Cajamarca:  no deformities or breast masses noted Lungs:  Clear bilaterally to auscultation and percussion. Heart:  PMI nondisplaced, regular rate and  rhythm, normal S1-S2, no click or murmur. Carotids full without bruit Abdomen:  no obvious tenderness, good bowel sounds, no organomegaly, pulsatile aorta this not widened. Good peripheral pulses., no bruit Msk:  Back normal, normal gait. Muscle strength and tone normal. Pulses:  pulses normal in all 4 extremities Extremities:  No clubbing or cyanosis. Neurologic:  Alert and oriented x 3. Skin:  Intact without lesions or  rashes. Psych:  Normal affect.   EKG  Procedure date:  05/06/2010  Findings:      normal sinus rhythm, sinus arrhythmia, normal EKG  Impression & Recommendations:  Problem # 1:  CHEST DISCOMFORT (ICD-786.59) Assessment Deteriorated I feel this is not cardiac. This probably related to stress tension and anxiety. I encouraged her to get regular exercise, eat balanced meals, get plenty of sleep.  Problem # 2:  ARM PAIN, LEFT (ICD-729.5) Assessment: New Do not feel this is cardiac.  Problem # 3:  PALPITATIONS (ICD-785.1) Assessment: Deteriorated I suspect is related to stress and anxiety as well. She does not have true prolapse. Reassurance given Orders: EKG w/ Interpretation (93000)  Problem # 4:  IRRITABLE BOWEL SYNDROME (ICD-564.1) I suspect the left upper quadrant discomfort is related to this. No indication for abdominal ultrasound  Patient Instructions: 1)  Your physician recommends that you schedule a follow-up appointment in:  as needed with Dr. Daleen Squibb 2)  Your physician recommends that you continue on your current medications as directed. Please refer to the Current Medication list given to you today.

## 2010-08-13 NOTE — Letter (Signed)
Summary: [facility]  [facility]   Imported By: Sherian Rein 05/21/2010 08:33:39  _____________________________________________________________________  External Attachment:    Type:   Image     Comment:   External Document

## 2010-08-13 NOTE — Letter (Signed)
**Note Amber-Identified via Obfuscation** Summary: New Patient letter  [facility]  [address] [place], [place] [address]   Phone: [phone]  Fax: [phone]       09/08/2009 MRN: [id number]  [name] [address] [place], [place]  [address]  Dear Ms. [name],  Welcome to the Gastroenterology Division at [facility].    You are scheduled to see Dr.  [name] on October 08, 2009 at 10:30am on the 3rd floor at [facility], [address]  We ask that you try to arrive at our office 15 minutes prior to your appointment time to allow for check-in.  We would like you to complete the enclosed self-administered evaluation form prior to your visit and bring it with you on the day of your appointment.  We will review it with you.  Also, please bring a complete list of all your medications or, if you prefer, bring the medication bottles and we will list them.  Please bring your insurance card so that we may make a copy of it.  If your insurance requires a referral to see a specialist, please bring your referral form from your primary care physician.  Co-payments are due at the time of your visit and may be paid by cash, check or credit card.     Your office visit will consist of a consult with your physician (includes a physical exam), any laboratory testing he/she may order, scheduling of any necessary diagnostic testing (e.g. x-ray, ultrasound, CT-scan), and scheduling of a procedure (e.g. Endoscopy, Colonoscopy) if required.  Please allow enough time on your schedule to allow for any/all of these possibilities.    If you cannot keep your appointment, please call [phone] to cancel or reschedule prior to your appointment date.  This allows [place] the opportunity to schedule an appointment for another patient in need of care.  If you do not cancel or reschedule by 5 p.m. the business day prior to your appointment date, you will be charged a $50.00 late cancellation/no-show fee.    Thank you for  choosing  Gastroenterology for your medical needs.  We appreciate the opportunity to care for you.  Please visit [place] at our website  to learn more about our practice.                     Sincerely,                                                             The Gastroenterology Division

## 2010-11-24 NOTE — Assessment & Plan Note (Signed)
[facility]                            CARDIOLOGY OFFICE NOTE   NAME:[name], [name]                       MRN:          [id number]  DATE:11/09/2007                            DOB:          1961/04/15    I was asked by Dr. Marikay Alar Magrinat to evaluate [name] and consult on  her chest pressure and discomfort.   HISTORY OF PRESENT ILLNESS:  She is 50 years of age, divorced, white  female, mother of three whose been having some chest tightness, aching  up into her left shoulder with exertion.  She says she quits walking her  dog when this happens.  She is not walking nearly 3 hours a week as she  would like to do.   RISK FACTORS:  Chest radiation for breast cancer, mild hyperlipidemia,  and she thinks a family history of coronary disease.   PAST MEDICAL HISTORY:  She is intolerant of PENICILLIN.  She has no  history of dye allergies.  She is currently on Xanax 0.25 mg q.h.s., a  multivitamin daily, B complex daily, tamoxifen 5 mg a day.   She quit smoking in 1987.  She does not drink.   PAST SURGICAL HISTORY:  Right breast lumpectomy followed by chemo and  radiation treatment.   FAMILY HISTORY:  No absolute premature history of coronary disease in  the family.   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  She has three children.   She was told she had mitral valve prolapse years ago but on  echocardiogram here in 2006 there was no evidence of prolapse.  In  addition, she had normal left ventricular function, EF of 60-70%.   REVIEW OF SYSTEMS:  Other than the HPI is positive for chronic fatigue,  gastroesophageal reflux.  Other review of systems are negative.   PHYSICAL EXAMINATION:  Her blood pressure is 126/68, her pulse is 80 and  regular.  Her weight is 148.  She is 5 feet 4. She is in no acute  distress.  She is very pleasant.  SKIN:  Warm and dry.  HEENT:  Normocephalic, atraumatic.   PERRLA.  Extraocular movement is  intact.  Sclerae are clear.  Facial symmetry is normal.  Carotids  upstrokes are equal bilaterally without bruits, no JVD.  Thyroid is not  enlarged.  Trachea is midline.  LUNGS:  Clear.  HEART:  Reveals a regular rate and rhythm.  No murmur, rub or gallop.  ABDOMEN:  Soft, good bowel sounds. No midline bruit.  EXTREMITIES:  No clubbing, cyanosis or edema. Pulses are intact.   Electrocardiogram is essentially normal except for poor R wave  progression across the anterior precordium.   ASSESSMENT/PLAN:  Chest tightness and pressure with aching in her left  shoulder with exertion.  This is certainly limiting her exercise. She  has had radiation to the chest and also has mild hyperlipidemia.   PLAN:  Exercise rest stress Myoview.     Thomas C. Daleen Squibb, MD, [facility]  Electronically Signed    TCW/MedQ  DD: 11/09/2007  DT: 11/09/2007  Job #: [id number]   cc:   Valentino Hue. Magrinat, M.D.

## 2010-11-27 NOTE — Op Note (Signed)
NAMEHAFSA, [name]                  ACCOUNT NO.:  [id number]   MEDICAL RECORD NO.:  [id number]          PATIENT TYPE:  AMB   LOCATION:  SDS                          FACILITY:  [facility]   PHYSICIAN:  Rose Phi. [name], M.D.   DATE OF BIRTH:  1960-10-24   DATE OF PROCEDURE:  05/18/2006  DATE OF DISCHARGE:                                 OPERATIVE REPORT   PREOPERATIVE DIAGNOSIS:  Stage I carcinoma of the right breast.   POSTOPERATIVE DIAGNOSIS:  Stage I carcinoma of the right breast.   OPERATION:  Insertion of Port-A-Cath under fluoroscopic control.   SURGEON:  Rose Phi. [name], M.D.   ANESTHESIA:  MAC.   OPERATIVE PROCEDURE:  The patient was placed on the operating table with the  arms down by the side and the face turned to the right and the left upper  chest and neck prepped and draped in the usual fashion.  Under local  anesthesia, a left subclavian puncture was carried out without difficulty  and the guidewire inserted.  Proper positioning of the guidewire was  confirmed by fluoroscopy.   Under local anesthesia an incision was made on the anterior chest wall at  the upper part of the breast and a pocket developed for the implantable  port.  We then tunneled between the puncture site and the port and the  pocket and passed the catheter through that, which I then connected to the  port and placed the port in the pocket.  We then trimmed the catheter tip,  measured on the chest wall, to go to the fourth interspace at the level of  the cavoatrial junction.   I then passed the dilator and peel-away sheath over the wire and removed the  wire, followed by the dilator, and passed the catheter through the peel-away  sheath and then removed it.   Again, C-arm fluoroscopy was used to confirm that there was no kinking in  the system and that the catheter tip was in the superior vena cava.   Incisions were closed with 3-0 Vicryl and subcuticular 4-0 Monocryl.  Dermabond was applied.   I  then accessed with a right-angled Huber safety catheter apparatus, and it  easily aspirated and irrigated.  I fully heparinized it and then we left it  accessed as her chemotherapy was to start tomorrow.   Dressings were applied and the patient transferred to the recovery room in  satisfactory condition, having tolerated the procedure well.      Rose Phi. [name], M.D.  Electronically Signed     PRY/MEDQ  D:  05/18/2006  T:  05/18/2006  Job:  [id number]

## 2010-11-27 NOTE — Op Note (Signed)
NAMEIVOREE, [name]                  ACCOUNT NO.:  [id number]   MEDICAL RECORD NO.:  [id number]            PATIENT TYPE:   LOCATION:                                 FACILITY:   PHYSICIAN:  Rose Phi. Young, M.D.        DATE OF BIRTH:   DATE OF PROCEDURE:  04/22/2006  DATE OF DISCHARGE:                                 OPERATIVE REPORT   PREOPERATIVE DIAGNOSIS:  Stage I carcinoma of the right breast.   POSTOPERATIVE DIAGNOSIS:  Stage I carcinoma of the right breast.   OPERATION:  1. Blue dye injection.  2. Right partial mastectomy with needle localization and specimen      mammogram.  3. Right axillary sentinel lymph node biopsy.   SURGEON:  Rose Phi. [name], M.D.   ANESTHESIA:  General.   OPERATIVE PROCEDURE:  Prior to coming to the operating room a localizing  wire had been placed for a lesion that was right at the areolar margin at  about  the 3 o'clock position.  In addition, 1 mCi of technetium sulfur  colloid was injected intradermally.  After suitable general anesthesia was  induced, the patient was placed in the supine position with the arms  extended on the arm board.  Then 5 mL of a mixture of 2 mL of methylene blue  and 3 mL of injectable saline was injected in the subareolar tissue of the  right breast; and the breast gently massaged for 3 minutes.  We then prepped  and draped in standard fashion.  The lesion itself was localized right near  the areolar margin fairly deep in the breast tissue.  A curved incision was  then made there; and the localizing wire was delivered into the incision and  then I did a wide excision of the wire and surrounding tissue.  The specimen  oriented for the pathologist and submitted for a specimen mammogram.  While  that was being done I was a little concerned about the superior margin so I  actually excised a little more tissue there, to have the pathologist  evaluate it.   Specimen mammogram confirmed the removal of the lesion and the  specimen was  then submitted to the pathologist for touch preps.  While that was all being  done a short transverse right axillary incision was made with dissection  through the subcutaneous tissue to the clavipectoral fascia.  Deep to the  fascia was a hot and blue lymph node.  I removed that as a sentinel node.  I  could not identify any other hot, blue, or palpable nodes.  The sentinel  node incision was then closed after injecting it with Marcaine.  The closure  was in two layers with 3-0 Vicryl and a 4-0 subcuticular Monocryl.  The  sentinel node was reported as negative for metastatic disease; and in the  margins were  clean of tumor.  The lumpectomy incision was then closed with a couple of  Vicryl sutures, and then a subcuticular 4-0 Monocryl and Steri-Strips.  Dressings were then applied; and the  patient transferred to the recovery  room in satisfactory condition having tolerated procedure well.      Rose Phi. [name], M.D.  Electronically Signed     PRY/MEDQ  D:  04/22/2006  T:  04/25/2006  Job:  [id number]

## 2010-11-27 NOTE — Op Note (Signed)
NAMEGRADY, LUCCI                  ACCOUNT NO.:  [id number]   MEDICAL RECORD NO.:  [id number]          PATIENT TYPE:  AMB   LOCATION:  DSC                          FACILITY:  [facility]   PHYSICIAN:  Rose Phi. [name], M.D.   DATE OF BIRTH:  [birth year]   DATE OF PROCEDURE:  10/05/2006  DATE OF DISCHARGE:                               OPERATIVE REPORT   PREOPERATIVE DIAGNOSIS:  Stage II carcinoma of the right breast.   POSTOPERATIVE DIAGNOSIS:  Stage II carcinoma in the right breast.   OPERATION:  Removal of Port-A-Cath.   SURGEON:  Rose Phi. [name], M.D.   ANESTHESIA:  Local.   OPERATIVE PROCEDURE:  The patient was placed on the operating table and  the upper chest prepped and draped in the usual fashion.  After  obtaining good local anesthesia at the port site, I incised the old  incision and exposed and the port.  I grasped the catheter and removed  it from the subclavian vein.  There was no bleeding.   With traction on the catheter, we divided the 2 sutures holding the port  in place and removed the whole port.   A subcuticular closure of 4-0 Monocryl followed by Dermabond was carried  out.  A light dressing was applied.  The then allowed to go home.      Rose Phi. [name], M.D.  Electronically Signed     PRY/MEDQ  D:  10/05/2006  T:  10/05/2006  Job:  [id number]

## 2011-01-05 ENCOUNTER — Encounter: Payer: Self-pay | Admitting: Cardiology

## 2011-02-08 ENCOUNTER — Telehealth: Payer: Self-pay | Admitting: Internal Medicine

## 2011-02-08 NOTE — Telephone Encounter (Signed)
Patient has had a LUQ pain for 1 year.  She reports that the pain is positional at times and comes and goes.  She had an episode yesterday that was severe.  She is scheduled for 03/01/11 at 2:15 with Dr Leone Payor

## 2011-02-16 ENCOUNTER — Other Ambulatory Visit: Payer: Self-pay | Admitting: Oncology

## 2011-02-16 DIAGNOSIS — Z853 Personal history of malignant neoplasm of breast: Secondary | ICD-10-CM

## 2011-03-01 ENCOUNTER — Telehealth: Payer: Self-pay | Admitting: Internal Medicine

## 2011-03-01 ENCOUNTER — Ambulatory Visit: Payer: Self-pay | Admitting: Internal Medicine

## 2011-03-02 ENCOUNTER — Encounter: Payer: Self-pay | Admitting: Internal Medicine

## 2011-04-06 ENCOUNTER — Other Ambulatory Visit: Payer: Self-pay | Admitting: Obstetrics and Gynecology

## 2011-04-06 HISTORY — PX: HYSTEROSCOPY DIAGNOSTIC: PRO49

## 2011-04-07 ENCOUNTER — Ambulatory Visit (INDEPENDENT_AMBULATORY_CARE_PROVIDER_SITE_OTHER): Payer: Commercial Managed Care - PPO | Admitting: Internal Medicine

## 2011-04-07 ENCOUNTER — Encounter: Payer: Self-pay | Admitting: Internal Medicine

## 2011-04-07 VITALS — BP 90/70 | HR 94 | Ht 64.0 in | Wt 158.0 lb

## 2011-04-07 DIAGNOSIS — K219 Gastro-esophageal reflux disease without esophagitis: Secondary | ICD-10-CM

## 2011-04-07 DIAGNOSIS — Z853 Personal history of malignant neoplasm of breast: Secondary | ICD-10-CM

## 2011-04-07 DIAGNOSIS — K2270 Barrett's esophagus without dysplasia: Secondary | ICD-10-CM

## 2011-04-07 DIAGNOSIS — K589 Irritable bowel syndrome without diarrhea: Secondary | ICD-10-CM

## 2011-04-07 DIAGNOSIS — R1012 Left upper quadrant pain: Secondary | ICD-10-CM

## 2011-04-07 MED ORDER — LANSOPRAZOLE 30 MG PO CPDR: 30.0000 mg | DELAYED_RELEASE_CAPSULE | Freq: Every day | ORAL | Status: DC

## 2011-04-07 NOTE — Assessment & Plan Note (Signed)
Tiny area in 2006 then not. Father had esophageal cancer.

## 2011-04-07 NOTE — Assessment & Plan Note (Addendum)
Start PPI - lansoprazole 30 mg daily. Reflux diet instructions given as well. I think this is causing the LUQ pain. He also has some classic symptoms. She also may be somewhat anxious over her 5 year anniversary for breast cancer causing some functional symptoms. We'll reassess when she presents for EGD.

## 2011-04-07 NOTE — Patient Instructions (Signed)
You have been scheduled for an Endoscopy with separate instructions given. Your prescription(s) has(have) been sent to your pharmacy for you to pick up. We have given you some information oh GERD for you to read.

## 2011-04-07 NOTE — Progress Notes (Signed)
  Subjective:    Patient ID: [name], female    DOB: [birth year], 50 y.o.   MRN: [id number]  HPI 50 yo ww with LUQ pain problems. Feels like her rib is catching on something at times. May occur with bending. Has to lie back to reduce the pain which may be severe. ?'s if it is a floating rib. It is is scaring her, she is 5 years out from a diagnosis of breast cancer. She is due for 5 year follow-up testing. Just saw Dr. Arelia Sneddon yesterday due to vaginal bleeding and may need a hysterctomy. The pain is not related to eating. It is intermittent. A very strong cup of coffee seemed to cause it and she stopped that and it has not been as severe. She has not elominated coffee but reduced it and half-caffeine strength.She has also had some classic heartburn symptoms as well and using Prilosec OTC for a week or so and recently stopped. Tums used also and were helpful.   Review of Systems Some left knee pain with kneeling. All other ROS negative or as above. Has been walking a mile every day and yoga.    Objective:   Physical Exam General: Well-developed, well-nourished and in no acute distress Vitals: Reviewed and listed above Eyes:anicteric. Mouth and posterior pharynx: normal.  Neck: supple w/o thyromegaly or mass.  Lungs: clear. Chest wall is not tender and ribs intact. Heart: S1S2, no rubs, murmurs, gallops. Abdomen: soft, tender in LUQ with deep palpation, no hepatosplenomegaly, hernia, or mass and BS+.  Rectal: Lymphatics: no cervical, Dale  nodes. Extremities:  no edema Skin no rash. Neuro: nonfocal. A&O x 3.  Psych: appropriate mood and  affect.        Assessment & Plan:

## 2011-04-15 ENCOUNTER — Ambulatory Visit
Admission: RE | Admit: 2011-04-15 | Discharge: 2011-04-15 | Disposition: A | Discharge status: Home / Self Care | Payer: Commercial Managed Care - PPO | Source: Ambulatory Visit | Attending: Oncology | Admitting: Oncology

## 2011-04-15 ENCOUNTER — Encounter (HOSPITAL_COMMUNITY): Admission: RE | Payer: Self-pay | Source: Ambulatory Visit

## 2011-04-15 ENCOUNTER — Ambulatory Visit (HOSPITAL_COMMUNITY): Admission: RE | Admit: 2011-04-15 | Payer: Self-pay | Source: Ambulatory Visit | Admitting: Obstetrics and Gynecology

## 2011-04-15 DIAGNOSIS — Z853 Personal history of malignant neoplasm of breast: Secondary | ICD-10-CM

## 2011-04-15 SURGERY — DILATATION & CURETTAGE/HYSTEROSCOPY WITH RESECTOCOPE
Anesthesia: Choice

## 2011-04-29 ENCOUNTER — Other Ambulatory Visit: Payer: Self-pay | Admitting: Oncology

## 2011-04-29 ENCOUNTER — Encounter (HOSPITAL_BASED_OUTPATIENT_CLINIC_OR_DEPARTMENT_OTHER): Payer: Commercial Managed Care - PPO | Admitting: Oncology

## 2011-04-29 DIAGNOSIS — C50919 Malignant neoplasm of unspecified site of unspecified female breast: Secondary | ICD-10-CM

## 2011-04-29 DIAGNOSIS — Z170 Estrogen receptor positive status [ER+]: Secondary | ICD-10-CM

## 2011-04-29 LAB — CBC WITH DIFFERENTIAL/PLATELET
BASO%: 0.7 % (ref 0.0–2.0)
EOS%: 4.7 % (ref 0.0–7.0)
HCT: 36.6 % (ref 34.8–46.6)
LYMPH%: 29.6 % (ref 14.0–49.7)
MCH: 27 pg (ref 25.1–34.0)
MCHC: 33.4 g/dL (ref 31.5–36.0)
MONO%: 9.7 % (ref 0.0–14.0)
NEUT%: 55.3 % (ref 38.4–76.8)
Platelets: 285 10*3/uL (ref 145–400)
lymph#: 1.6 10*3/uL (ref 0.9–3.3)

## 2011-04-29 LAB — COMPREHENSIVE METABOLIC PANEL
ALT: 11 U/L (ref 0–35)
AST: 18 U/L (ref 0–37)
Albumin: 3.9 g/dL (ref 3.5–5.2)
Alkaline Phosphatase: 56 U/L (ref 39–117)
BUN: 11 mg/dL (ref 6–23)
Calcium: 9.6 mg/dL (ref 8.4–10.5)
Chloride: 103 mEq/L (ref 96–112)
Potassium: 4 mEq/L (ref 3.5–5.3)
Sodium: 138 mEq/L (ref 135–145)
Total Protein: 7.2 g/dL (ref 6.0–8.3)

## 2011-04-29 LAB — VITAMIN D 25 HYDROXY (VIT D DEFICIENCY, FRACTURES): Vit D, 25-Hydroxy: 26 ng/mL — ABNORMAL LOW (ref 30–89)

## 2011-05-06 ENCOUNTER — Encounter (HOSPITAL_BASED_OUTPATIENT_CLINIC_OR_DEPARTMENT_OTHER): Payer: Commercial Managed Care - PPO | Admitting: Oncology

## 2011-05-06 DIAGNOSIS — Z853 Personal history of malignant neoplasm of breast: Secondary | ICD-10-CM

## 2011-05-06 DIAGNOSIS — Z170 Estrogen receptor positive status [ER+]: Secondary | ICD-10-CM

## 2011-05-07 LAB — ESTRADIOL, ULTRA SENS: Estradiol, Ultra Sensitive: 170 pg/mL

## 2011-06-01 ENCOUNTER — Other Ambulatory Visit: Payer: Commercial Managed Care - PPO | Admitting: Internal Medicine

## 2011-10-13 ENCOUNTER — Other Ambulatory Visit: Payer: Self-pay | Admitting: Otolaryngology

## 2011-10-13 DIAGNOSIS — IMO0001 Reserved for inherently not codable concepts without codable children: Secondary | ICD-10-CM

## 2012-03-07 ENCOUNTER — Other Ambulatory Visit: Payer: Self-pay | Admitting: Oncology

## 2012-03-07 DIAGNOSIS — Z853 Personal history of malignant neoplasm of breast: Secondary | ICD-10-CM

## 2012-03-07 DIAGNOSIS — Z9889 Other specified postprocedural states: Secondary | ICD-10-CM

## 2012-03-23 ENCOUNTER — Telehealth: Payer: Self-pay | Admitting: Oncology

## 2012-03-23 NOTE — Telephone Encounter (Signed)
S/w the pt and she is aware of her r/s appt time on 05/08/2012 due to the md is on call that afternoon.

## 2012-04-17 ENCOUNTER — Ambulatory Visit
Admission: RE | Admit: 2012-04-17 | Discharge: 2012-04-17 | Disposition: A | Discharge status: Home / Self Care | Payer: Commercial Managed Care - PPO | Source: Ambulatory Visit | Attending: Oncology | Admitting: Oncology

## 2012-04-17 DIAGNOSIS — Z853 Personal history of malignant neoplasm of breast: Secondary | ICD-10-CM

## 2012-04-17 DIAGNOSIS — Z9889 Other specified postprocedural states: Secondary | ICD-10-CM

## 2012-05-05 ENCOUNTER — Other Ambulatory Visit (HOSPITAL_BASED_OUTPATIENT_CLINIC_OR_DEPARTMENT_OTHER): Payer: Commercial Managed Care - PPO

## 2012-05-05 DIAGNOSIS — C50919 Malignant neoplasm of unspecified site of unspecified female breast: Secondary | ICD-10-CM

## 2012-05-05 LAB — CBC WITH DIFFERENTIAL/PLATELET
Basophils Absolute: 0.1 10*3/uL (ref 0.0–0.1)
Eosinophils Absolute: 0.2 10*3/uL (ref 0.0–0.5)
HGB: 12.1 g/dL (ref 11.6–15.9)
NEUT#: 3.1 10*3/uL (ref 1.5–6.5)
RDW: 13.6 % (ref 11.2–14.5)
lymph#: 1.4 10*3/uL (ref 0.9–3.3)

## 2012-05-05 LAB — COMPREHENSIVE METABOLIC PANEL (CC13)
CO2: 25 mEq/L (ref 22–29)
Calcium: 10.3 mg/dL (ref 8.4–10.4)
Chloride: 105 mEq/L (ref 98–107)
Creatinine: 0.8 mg/dL (ref 0.6–1.1)
Glucose: 92 mg/dl (ref 70–99)
Total Bilirubin: 0.4 mg/dL (ref 0.20–1.20)
Total Protein: 7 g/dL (ref 6.4–8.3)

## 2012-05-06 LAB — VITAMIN D 25 HYDROXY (VIT D DEFICIENCY, FRACTURES): Vit D, 25-Hydroxy: 39 ng/mL (ref 30–89)

## 2012-05-06 LAB — CANCER ANTIGEN 27.29: CA 27.29: 17 U/mL (ref 0–39)

## 2012-05-08 ENCOUNTER — Telehealth: Payer: Self-pay | Admitting: Oncology

## 2012-05-08 ENCOUNTER — Ambulatory Visit (HOSPITAL_BASED_OUTPATIENT_CLINIC_OR_DEPARTMENT_OTHER): Payer: Commercial Managed Care - PPO | Admitting: Oncology

## 2012-05-08 VITALS — BP 129/80 | HR 83 | Temp 98.2°F | Resp 20 | Ht 64.0 in | Wt 159.6 lb

## 2012-05-08 DIAGNOSIS — K219 Gastro-esophageal reflux disease without esophagitis: Secondary | ICD-10-CM

## 2012-05-08 DIAGNOSIS — Z853 Personal history of malignant neoplasm of breast: Secondary | ICD-10-CM

## 2012-05-08 DIAGNOSIS — F411 Generalized anxiety disorder: Secondary | ICD-10-CM

## 2012-05-08 NOTE — Progress Notes (Signed)
ID: Illene Calderon   DOB: Aug 24, 1960  MR#: [id number]  WUJ#:[id number]  PCP: Amber Carrow, MD GYN: [name] SU:  OTHER MD: [name]  HISTORY OF PRESENT ILLNESS: The patient palpated a thickening in her left breast, and this brought her to bilateral diagnostic mammograms on April 06, 2006.  Interestingly, the left breast showed no mass by either mammogram or ultrasound.  On the right, however, there was a 1.6 cm mass which was not palpable, and which was easily seen by ultrasound.  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  With this information, the patient was referred to Dr. [name], and on April 18, 2006, [place] had bilateral breast MRIs.  These showed only a mass in the right breast, measuring 1.5 cm.  The left breast was clear.    With this information, the patient proceeded to right lumpectomy and sentinel lymph node dissection on April 22, 2006.  The final pathology here [id number]) showed a 4 cm infiltrating ductal carcinoma, grade 2, with initially positive margins, but final negative margins after an additional superior margin was obtained.  There was evidence of lymphovascular invasion, but neither of the two sentinel lymph nodes were involved by tumor. Her subsequent history is as detailed below  INTERVAL HISTORY: Symphonie returns today for followup of her breast cancer. Since her last visit here she is separated from her husband, and they will is ready to file a definitive divorced this coming month. She is very at much at peace with that decision. The other news is that she had her third grandchild earlier this month.  REVIEW OF SYSTEMS: She has 3 different transcription jobs and does not have a lot of time left over for exercise. She describes herself is sleeping poorly and is mildly fatigued. She has significant  problems with her contacts and this makes her a little bit dizzy. She also has sinus problems and her Calderon feels very full. Occasionally she has difficulty swallowing. She has sister palpitation, ankle swelling, easy bruising, and occasionally feels anxious, depressed, and forgetful. She is having hot flashes. She continues to have reflux problems. Otherwise a detailed review of systems today was noncontributory  PAST MEDICAL HISTORY: Past Medical History  Diagnosis Date  . Chest discomfort   . Arm pain, left   . Palpitations   . Breast cancer     hx  . GERD (gastroesophageal reflux disease)   . IBS (irritable bowel syndrome)   . Mitral valve prolapse   . Hyperlipidemia   . Hemorrhoids, internal   . Schatzki's ring     hx  . Barrett's esophagus   The past medical history is significant for mitral valve prolapse, requiring antibiotic prophylaxis.  The patient has a history of chronically low MCV, suggestive of thalassemia.  She is status post removal of a left paraovarian cyst.  She has a history of irritable bowel syndrome, a history of GERD, and a history of Barrett's esophagus.    PAST SURGICAL HISTORY: Past Surgical History  Procedure Date  . Ovarian cyst removal   . Esophagogastroduodenoscopy 01/2004, 02/2007    2006 Short Barrett's and 2008 not identified  . Colonoscopy 01/2004    internal hemorrhoids  . Breast lumpectomy 2007    right, with sentinel node dissection  . Portacath placement 2007    removed   .  Hysteroscopy diagnostic 04/06/2011    FAMILY HISTORY Family History  Problem Relation Age of Onset  . Coronary artery disease Neg Hx     No absolute premature coronary disease  . Cancer Father     esophogeal  . Melanoma Maternal Aunt   . Lung cancer Maternal Grandmother   . Diabetes type I Daughter   The patient's father died from cancer of the esophagus at the age of 39.  The patient's mother, [name], had a history of cervical cancer at the age of 21.  Pam's mother,  the patient's maternal grandmother had a mastectomy at the age of 9.  She is now 49 years old.  There is no other history of breast or ovarian cancer in the family.   GYNECOLOGIC HISTORY: [name] is GX P3, first pregnancy to term age 21.  She did nurse. She stopped having regular menstrual periods with chemotherapy, but never completely stopped, and had a period most recently approximately 2 months ago. The one before that was 3-4 months prior.  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 Her three children are [name], who  works in Furniture conservator/restorer, [name] features editor, who is working to be a IT consultant, and Health and safety inspector, who hopes to become eventually a Engineer, civil (consulting).  The patient is a Musician.     ADVANCED DIRECTIVES: not in place  HEALTH MAINTENANCE: History  Substance Use Topics  . Smoking status: Former Smoker    Types: Cigarettes  . Smokeless tobacco: Never Used   Comment: quit 1987  . Alcohol Use: Yes     socially     Colonoscopy:  PAP:  Bone density:  Lipid panel:  Allergies  Allergen Reactions  . Latex     Rash and blisters  . Penicillins     Current Outpatient Prescriptions  Medication Sig Dispense Refill  . ALPRAZolam (XANAX) 0.5 MG tablet Take 0.5 mg by mouth at bedtime as needed.        . diphenhydramine-acetaminophen (TYLENOL PM) 25-500 MG TABS Take 1 tablet by mouth at bedtime as needed.        [name] ibuprofen (ADVIL,MOTRIN) 200 MG tablet Take 400 mg by mouth every 8 (eight) hours as needed.       . lansoprazole (PREVACID) 30 MG capsule Take 1 capsule (30 mg total) by mouth daily. 30 minutes before breakfast  30 capsule  3  . triamcinolone (NASACORT) 55 MCG/ACT nasal inhaler Place 2 sprays into the nose daily. As needed         OBJECTIVE: Middle-aged white woman who appears well Filed Vitals:   05/08/12 1054  BP: 129/80  Pulse: 83  Temp: 98.2 F (36.8 C)  Resp: 20     Body mass index is 27.40 kg/(m^2).    ECOG FS:  0  Sclerae unicteric Oropharynx clear No cervical or supraclavicular adenopathy Lungs no rales or rhonchi Heart regular rate and rhythm Abd benign MSK no focal spinal tenderness, no peripheral edema Neuro: nonfocal Breasts: The right breast is status post lumpectomy and radiation. There is no evidence of local recurrence. The right axilla is benign. Left breast is unremarkable.   LAB RESULTS: Lab Results  Component Value Date   WBC 5.3 05/05/2012   NEUTROABS 3.1 05/05/2012   HGB 12.1 05/05/2012   HCT 36.8 05/05/2012   MCV 80.6 05/05/2012   PLT 276 05/05/2012      Chemistry      Component Value Date/Time   NA 140 05/05/2012 1518  NA 138 04/29/2011 1611   NA 138 04/29/2011 1611   K 4.1 05/05/2012 1518   K 4.0 04/29/2011 1611   K 4.0 04/29/2011 1611   CL 105 05/05/2012 1518   CL 103 04/29/2011 1611   CL 103 04/29/2011 1611   CO2 25 05/05/2012 1518   CO2 28 04/29/2011 1611   CO2 28 04/29/2011 1611   BUN 10.0 05/05/2012 1518   BUN 11 04/29/2011 1611   BUN 11 04/29/2011 1611   CREATININE 0.8 05/05/2012 1518   CREATININE 0.67 04/29/2011 1611   CREATININE 0.67 04/29/2011 1611      Component Value Date/Time   CALCIUM 10.3 05/05/2012 1518   CALCIUM 9.6 04/29/2011 1611   CALCIUM 9.6 04/29/2011 1611   ALKPHOS 62 05/05/2012 1518   ALKPHOS 56 04/29/2011 1611   ALKPHOS 56 04/29/2011 1611   AST 17 05/05/2012 1518   AST 18 04/29/2011 1611   AST 18 04/29/2011 1611   ALT 13 05/05/2012 1518   ALT 11 04/29/2011 1611   ALT 11 04/29/2011 1611   BILITOT 0.40 05/05/2012 1518   BILITOT 0.2* 04/29/2011 1611   BILITOT 0.2* 04/29/2011 1611       Lab Results  Component Value Date   LABCA2 17 05/05/2012    No components found with this basename: HQION629    No results found for this basename: INR:1;PROTIME:1 in the last 168 hours  Urinalysis    Component Value Date/Time   LABSPEC 1.020 05/31/2006 1221    STUDIES: Mm Digital Diagnostic Bilat  04/17/2012  **  RADIOLOGY REPORT **  Clinical Data:  51 year old female for annual bilateral mammograms. History of right breast cancer and lumpectomy in 2008.  DIGITAL DIAGNOSTIC BILATERAL MAMMOGRAM with CAD  Comparison: 04/15/2011 and prior mammograms dating back to 04/06/2006.  Findings: MLO and CC views of both breasts and a spot tangential view of the lumpectomy site performed.  Scarring in the right breast again identified.  There is no evidence of mass, nonsurgical distortion, or suspicious calcifications. Scattered fibroglandular densities are again identified.  Mammographic images were processed with CAD.  IMPRESSION: No specific mammographic evidence of malignancy.  Right breast scarring.  These findings were discussed with the patient.  She was encouraged to begin/continue monthly self exams and to contact her primary physician if any changes noted.  BI-RADS CATEGORY 2:  Benign finding(s).  Recommend bilateral diagnostic mammograms in 1 year.   Original Report Authenticated By: Rosendo Gros, M.D.     ASSESSMENT: 51 y.o. [place] woman status post right lumpectomy and sentinel lymph node biopsy October of 2007 for a T2 N0, grade 2 invasive ductal carcinoma, estrogen and progesterone receptor strongly positive with a low MIB-1 and HER-2 negative.  She received cyclophosphamide and docetaxel x4 completed January of 2008 followed by radiation, then was on tamoxifen briefly (May of 2008 to November of 2009).   PLAN: We are not ready to consider aromatase inhibitors, since she is still occasionally menstruating, and she definitely does not want to go back on tamoxifen. Accordingly we will continue observation alone. Even though the family history is not suggestive of the BRCA mutation, she is very concerned particularly because and and recently died from metastatic melanoma. I think she would benefit from a formal genetics consult and this is being placed. I am also referring her for dermatologic  screening.  Otherwise she will return to see me in one year. She knows to call for any problems that may develop before the next visit.  Danyell Shader C    05/08/2012

## 2012-05-08 NOTE — Telephone Encounter (Signed)
gve the pt her jan,oct 2014 appt calendar along with the appt to see dr Lu Duffel tafeen in [place].

## 2012-07-17 ENCOUNTER — Ambulatory Visit (HOSPITAL_BASED_OUTPATIENT_CLINIC_OR_DEPARTMENT_OTHER): Payer: Commercial Managed Care - PPO | Admitting: Genetic Counselor

## 2012-07-17 ENCOUNTER — Other Ambulatory Visit: Payer: Self-pay | Admitting: *Deleted

## 2012-07-17 ENCOUNTER — Other Ambulatory Visit: Payer: Commercial Managed Care - PPO | Admitting: Lab

## 2012-07-17 ENCOUNTER — Encounter: Payer: Self-pay | Admitting: Genetic Counselor

## 2012-07-17 DIAGNOSIS — Z853 Personal history of malignant neoplasm of breast: Secondary | ICD-10-CM

## 2012-07-17 DIAGNOSIS — Z8041 Family history of malignant neoplasm of ovary: Secondary | ICD-10-CM

## 2012-07-17 DIAGNOSIS — IMO0002 Reserved for concepts with insufficient information to code with codable children: Secondary | ICD-10-CM

## 2012-07-17 DIAGNOSIS — Z803 Family history of malignant neoplasm of breast: Secondary | ICD-10-CM

## 2012-07-17 NOTE — Progress Notes (Signed)
Dr.  Raymond Gurney Magrinat requested a consultation for genetic counseling and risk assessment for [name], a 52 y.o. female, for discussion of her breast cancer and family history of breast, colon, esophageal, and brain cancer and melanoma. She presents to clinic today to discuss the possibility of a genetic predisposition to cancer, and to further clarify her risks, as well as her family members' risks for cancer.   HISTORY OF PRESENT ILLNESS: In 2008, at the age of 74, [name] was diagnosed with Breast cancer. This was treated with lumpectomy, chemotherapy and radiation.    Past Medical History  Diagnosis Date  . Chest discomfort   . Arm pain, left   . Palpitations   . GERD (gastroesophageal reflux disease)   . IBS (irritable bowel syndrome)   . Mitral valve prolapse   . Hyperlipidemia   . Hemorrhoids, internal   . Schatzki's ring     hx  . Barrett's esophagus   . Breast cancer 45    hx    Past Surgical History  Procedure Date  . Ovarian cyst removal   . Esophagogastroduodenoscopy 01/2004, 02/2007    2006 Short Barrett's and 2008 not identified  . Colonoscopy 01/2004    internal hemorrhoids  . Breast lumpectomy 2007    right, with sentinel node dissection  . Portacath placement 2007    removed   . Hysteroscopy diagnostic 04/06/2011    History  Substance Use Topics  . Smoking status: Former Smoker -- 0.5 packs/day for 11 years    Types: Cigarettes  . Smokeless tobacco: Never Used     Comment: quit 1987  . Alcohol Use: Yes     Comment: socially    REPRODUCTIVE HISTORY AND PERSONAL RISK ASSESSMENT FACTORS: Menarche was at age 27.   Menopause at age 17. Uterus Intact: Yes Ovaries Intact: Yes G3P3A0 , first live birth at age 24  She has not previously undergone treatment for infertility.   Never used OCPS   She has not used HRT in the past.    FAMILY HISTORY:  We obtained a detailed, 4-generation family history.  Significant diagnoses are listed  below: Family History  Problem Relation Age of Onset  . Coronary artery disease Neg Hx     No absolute premature coronary disease  . Esophageal cancer Father 3  . Melanoma Maternal Aunt 60  . Lung cancer Maternal Grandmother 80  . Breast cancer Maternal Grandmother     dx in her 67s  . Diabetes type I Daughter   . Cervical cancer Mother 16  . Brain cancer Paternal Uncle     dx in his 65s  . Colon cancer Maternal Grandfather     early 68s  . Ovarian cancer Cousin     dx in her 74s  The patient was diagnosed with breast cancer at age 61.  Her mother was diagnosed with cervical cancer at age 80 and father, who was a smoker and drank heavy amounts of alcohol, had esophogeal cancer.  She had a paternal uncle with brain cancer in his 49s and a paternal cousin with ovarian cancer in her 31s.  The patients' maternal aunt was diagnosed with melanoma at age 79 and died at 35.  Her maternal grandfather died of colon cacner in his late 20s and her grandmother had breast cancer in her 57s and lung cancer at age 53s.  She was a smoker.    Patient's maternal ancestors are of [place] and  [place] descent, and paternal ancestors are of [place] descent. There is no reported Ashkenazi Jewish ancestry. There is no  known consanguinity.  GENETIC COUNSELING RISK ASSESSMENT, DISCUSSION, AND SUGGESTED FOLLOW UP: We reviewed the natural history and genetic etiology of sporadic, familial and hereditary cancer syndromes.  About 5-10% of breast cancer is hereditary.  Of this, about 85% is the result of a BRCA1 or BRCA2 mutation.  We reviewed the red flags of hereditary cancer syndromes and the dominant inheritance patterns.  If the BRCA testing is negative, we discussed that we could be testing for the wrong gene.  We discussed gene panels, and that several cancer genes that are associated with different cancers can be tested at the same time.  Because of the different types of cancer that are in the patient's family, we  will consider one of the panel tests if she is negative for BRCA mutations.   The patient's personal history of breast cancer and family history of brain, ovarian, cervical, breast, colon and lung cancer and melanoma is suggestive of the following possible diagnosis: hereditary cancer syndrome  We discussed that identification of a hereditary cancer syndrome may help her care providers tailor the patients medical management. If a mutation indicating a hereditary cancer syndrome is detected in this case, the [employer] recommendations would include increased cancer surveillance and possible prophylactic surgery. If a mutation is detected, the patient will be referred back to the referring provider and to any additional appropriate care providers to discuss the relevant options.   If a mutation is not found in the patient, this will decrease the likelihood of a hereditary cancer syndroem as the explanation for her breast cancer. Cancer surveillance options would be discussed for the patient according to the appropriate standard National Comprehensive Cancer Network and American Cancer Society guidelines, with consideration of their personal and family history risk factors. In this case, the patient will be referred back to their care providers for discussions of management.   In order to estimate her chance of having a BRCA mutation, we used statistical models (PENN II and BRACAPro) and laboratory data that take into account her personal medical history, family history and ancestry.  Because each model is different, there can be a lot of variability in the risks they give.  Therefore, these numbers must be considered a rough range and not a precise risk of having a BRCA mutation.  These models estimate that she has approximately a 1.0-11% chance of having a mutation. Based on this assessment of her family and personal history, genetic testing is recommended.  After considering the  risks, benefits, and limitations, the patient decided to think about testing.  She will contact me if she decides to pursue testing.   The patient was seen for a total of 60 minutes, greater than 50% of which was spent face-to-face counseling.  This plan is being carried out per Dr. Ruthann Cancer recommendations.  This note will also be sent to the referring provider via the electronic medical record. The patient will be supplied with a summary of this genetic counseling discussion as well as educational information on the discussed hereditary cancer syndromes following the conclusion of their visit.   Patient was discussed with Dr. Drue Second.  _______________________________________________________________________ For Office Staff:  Number of people involved in session: 2 Was an Intern/ student involved with case: no

## 2012-07-18 ENCOUNTER — Other Ambulatory Visit: Payer: Self-pay | Admitting: Dermatology

## 2012-07-20 ENCOUNTER — Other Ambulatory Visit (HOSPITAL_BASED_OUTPATIENT_CLINIC_OR_DEPARTMENT_OTHER): Payer: Commercial Managed Care - PPO | Admitting: Lab

## 2012-07-20 DIAGNOSIS — Z853 Personal history of malignant neoplasm of breast: Secondary | ICD-10-CM

## 2012-07-20 LAB — CBC WITH DIFFERENTIAL/PLATELET
BASO%: 1.3 % (ref 0.0–2.0)
Basophils Absolute: 0.1 10*3/uL (ref 0.0–0.1)
EOS%: 6.5 % (ref 0.0–7.0)
HGB: 12.6 g/dL (ref 11.6–15.9)
MCH: 26.1 pg (ref 25.1–34.0)
MCV: 81 fL (ref 79.5–101.0)
MONO%: 10.2 % (ref 0.0–14.0)
NEUT#: 2.4 10*3/uL (ref 1.5–6.5)
RBC: 4.84 10*6/uL (ref 3.70–5.45)
RDW: 13.2 % (ref 11.2–14.5)
lymph#: 1.4 10*3/uL (ref 0.9–3.3)

## 2012-07-20 LAB — COMPREHENSIVE METABOLIC PANEL (CC13)
ALT: 14 U/L (ref 0–55)
AST: 21 U/L (ref 5–34)
Albumin: 3.8 g/dL (ref 3.5–5.0)
Alkaline Phosphatase: 52 U/L (ref 40–150)
BUN: 9 mg/dL (ref 7.0–26.0)
Calcium: 9.7 mg/dL (ref 8.4–10.4)
Chloride: 104 mEq/L (ref 98–107)
Potassium: 4.4 mEq/L (ref 3.5–5.1)
Sodium: 139 mEq/L (ref 136–145)
Total Protein: 7.2 g/dL (ref 6.4–8.3)

## 2012-08-09 ENCOUNTER — Telehealth: Payer: Self-pay | Admitting: Genetic Counselor

## 2012-08-09 NOTE — Telephone Encounter (Signed)
Revealed negative genetic test results 

## 2012-08-13 ENCOUNTER — Other Ambulatory Visit: Payer: Self-pay | Admitting: Oncology

## 2012-08-13 NOTE — Progress Notes (Signed)
Bruce skin biopsies showed significant atypia with involved margins. She needs to conservative reexcision. I called her but was unable to reach her. I Center in the mail asking whether she has been referred to a surgeon a ready. If not we will do that for her.

## 2012-08-17 ENCOUNTER — Encounter: Payer: Self-pay | Admitting: Genetic Counselor

## 2012-08-28 ENCOUNTER — Other Ambulatory Visit: Payer: Self-pay | Admitting: Dermatology

## 2012-09-02 ENCOUNTER — Emergency Department (HOSPITAL_COMMUNITY)
Admission: EM | Admit: 2012-09-02 | Discharge: 2012-09-02 | Disposition: A | Discharge status: Home / Self Care | Payer: Commercial Managed Care - PPO | Attending: Emergency Medicine | Admitting: Emergency Medicine

## 2012-09-02 ENCOUNTER — Encounter (HOSPITAL_COMMUNITY): Payer: Self-pay | Admitting: Emergency Medicine

## 2012-09-02 DIAGNOSIS — F411 Generalized anxiety disorder: Secondary | ICD-10-CM | POA: Insufficient documentation

## 2012-09-02 DIAGNOSIS — F3289 Other specified depressive episodes: Secondary | ICD-10-CM | POA: Insufficient documentation

## 2012-09-02 DIAGNOSIS — Z853 Personal history of malignant neoplasm of breast: Secondary | ICD-10-CM | POA: Insufficient documentation

## 2012-09-02 DIAGNOSIS — F32A Depression, unspecified: Secondary | ICD-10-CM

## 2012-09-02 DIAGNOSIS — F329 Major depressive disorder, single episode, unspecified: Secondary | ICD-10-CM | POA: Insufficient documentation

## 2012-09-02 DIAGNOSIS — F419 Anxiety disorder, unspecified: Secondary | ICD-10-CM

## 2012-09-02 DIAGNOSIS — Z87891 Personal history of nicotine dependence: Secondary | ICD-10-CM | POA: Insufficient documentation

## 2012-09-02 DIAGNOSIS — R45851 Suicidal ideations: Secondary | ICD-10-CM | POA: Insufficient documentation

## 2012-09-02 DIAGNOSIS — K219 Gastro-esophageal reflux disease without esophagitis: Secondary | ICD-10-CM | POA: Insufficient documentation

## 2012-09-02 DIAGNOSIS — Z8719 Personal history of other diseases of the digestive system: Secondary | ICD-10-CM | POA: Insufficient documentation

## 2012-09-02 DIAGNOSIS — Z79899 Other long term (current) drug therapy: Secondary | ICD-10-CM | POA: Insufficient documentation

## 2012-09-02 DIAGNOSIS — Z8679 Personal history of other diseases of the circulatory system: Secondary | ICD-10-CM | POA: Insufficient documentation

## 2012-09-02 LAB — COMPREHENSIVE METABOLIC PANEL
Albumin: 4 g/dL (ref 3.5–5.2)
BUN: 11 mg/dL (ref 6–23)
Creatinine, Ser: 0.67 mg/dL (ref 0.50–1.10)
Total Protein: 7.8 g/dL (ref 6.0–8.3)

## 2012-09-02 LAB — CBC
HCT: 40.4 % (ref 36.0–46.0)
MCH: 25.8 pg — ABNORMAL LOW (ref 26.0–34.0)
MCHC: 32.2 g/dL (ref 30.0–36.0)
MCV: 80.3 fL (ref 78.0–100.0)
RDW: 13.1 % (ref 11.5–15.5)

## 2012-09-02 LAB — ETHANOL: Alcohol, Ethyl (B): <11 mg/dL (ref 0–11)

## 2012-09-02 LAB — RAPID URINE DRUG SCREEN, HOSP PERFORMED
Benzodiazepines: NOT DETECTED
Cocaine: NOT DETECTED

## 2012-09-02 LAB — SALICYLATE LEVEL: Salicylate Lvl: <2 mg/dL — ABNORMAL LOW (ref 2.8–20.0)

## 2012-09-02 LAB — ACETAMINOPHEN LEVEL: Acetaminophen (Tylenol), Serum: <15 ug/mL (ref 10–30)

## 2012-09-02 MED ORDER — ALUM & MAG HYDROXIDE-SIMETH 200-200-20 MG/5ML PO SUSP: 30.0000 mL | ORAL | Status: DC | PRN

## 2012-09-02 MED ORDER — LORAZEPAM 1 MG PO TABS: 1.0000 mg | ORAL_TABLET | Freq: Three times a day (TID) | ORAL | Status: DC | PRN

## 2012-09-02 MED ORDER — IBUPROFEN 200 MG PO TABS: 600.0000 mg | ORAL_TABLET | Freq: Three times a day (TID) | ORAL | Status: DC | PRN

## 2012-09-02 MED ORDER — ZOLPIDEM TARTRATE 5 MG PO TABS: 5.0000 mg | ORAL_TABLET | Freq: Every evening | ORAL | Status: DC | PRN

## 2012-09-02 MED ORDER — ONDANSETRON HCL 4 MG PO TABS: 4.0000 mg | ORAL_TABLET | Freq: Three times a day (TID) | ORAL | Status: DC | PRN

## 2012-09-02 MED ORDER — NICOTINE 21 MG/24HR TD PT24: 21.0000 mg | MEDICATED_PATCH | Freq: Every day | TRANSDERMAL | Status: DC

## 2012-09-02 NOTE — [facility] Counselor (Signed)
Writer and EDP Bednar spoke with patient. Pt calm and cooperative. She denies SI and HI and denies intent or plan. She is future oriented and states she wants to spend time with her grandchildren. Patient has an appt next week with her counselor whom Bednar attempted to contact. Pt able to contract for safety.  [name], [place] Assessment Counselor

## 2012-09-02 NOTE — ED Notes (Signed)
Pt verbalized understanding of follow up instructions.

## 2012-09-02 NOTE — ED Provider Notes (Signed)
Medical screening examination/treatment/procedure(s) were conducted as a shared visit with non-physician practitioner(s) and myself.  I personally evaluated the patient during the encounter.  Fleeting thoughts today of SI with OD or cut wrists but states would never do so, wants to live for daughters and grandchildren, has close OutPt f/u appts already. Denies HI/hallucinations. Denies SI now. Will sign safety contract with [facility].  Hurman Horn, MD 09/11/12 [phone]

## 2012-09-02 NOTE — ED Provider Notes (Signed)
History    This chart was scribed for non-physician practitioner working Trevor Mace, PA-C with Hurman Horn, MD by Magnus Sinning, ED Scribe. This patient was seen in room WTR6/WTR6 and the patient's care was started at 18:25    CSN: [id number]  Arrival date & time 09/02/12  1750    No chief complaint on file.   (Consider location/radiation/quality/duration/timing/severity/associated sxs/prior treatment) The history is provided by the patient. No language interpreter was used.   [name] is a 52 y.o. female who presents to the Emergency Department complaining of persistent intermittent moderate SI ,onset "few months." with associated constant feelings of "adrenalin" running though body and compulsive behaviors. The patient is separated from spouse and she says she has been texting ex incessantly and reports she is unable to stop. She also provides that she has been having thoughts of killing herself either by means of "taking entire bottle of Xanax or cutting wrists."   She reports hx of anxiety attacks since youth, but notes she has never had hospital stay for sxs. She describes sxs as feeling similar to anxiety attack. Pt has hx of breast cancer.  PCP :Dr. Larwance Sachs Past Medical History  Diagnosis Date  . Chest discomfort   . Arm pain, left   . Palpitations   . GERD (gastroesophageal reflux disease)   . IBS (irritable bowel syndrome)   . Mitral valve prolapse   . Hyperlipidemia   . Hemorrhoids, internal   . Schatzki's ring     hx  . Barrett's esophagus   . Breast cancer 45    hx    Past Surgical History  Procedure Laterality Date  . Ovarian cyst removal    . Esophagogastroduodenoscopy  01/2004, 02/2007    2006 Short Barrett's and 2008 not identified  . Colonoscopy  01/2004    internal hemorrhoids  . Breast lumpectomy  2007    right, with sentinel node dissection  . Portacath placement  2007    removed   . Hysteroscopy diagnostic  04/06/2011    Family  History  Problem Relation Age of Onset  . Coronary artery disease Neg Hx     No absolute premature coronary disease  . Esophageal cancer Father 15  . Melanoma Maternal Aunt 60  . Lung cancer Maternal Grandmother 80  . Breast cancer Maternal Grandmother     dx in her 64s  . Diabetes type I Daughter   . Cervical cancer Mother 67  . Brain cancer Paternal Uncle     dx in his 62s  . Colon cancer Maternal Grandfather     early 53s  . Ovarian cancer Cousin     dx in her 24s    History  Substance Use Topics  . Smoking status: Former Smoker -- 0.50 packs/day for 11 years    Types: Cigarettes  . Smokeless tobacco: Never Used     Comment: quit 1987  . Alcohol Use: Yes     Comment: socially   Review of Systems  Psychiatric/Behavioral: Positive for suicidal ideas and dysphoric mood. Negative for self-injury. The patient is nervous/anxious.   All other systems reviewed and are negative.    Allergies  Penicillins and Latex  Home Medications   Current Outpatient Rx  Name  Route  Sig  Dispense  Refill  . ALPRAZolam (XANAX) 0.5 MG tablet   Oral   Take 0.5 mg by mouth 2 (two) times daily as needed for anxiety.          [name]  escitalopram (LEXAPRO) 10 MG tablet   Oral   Take 10 mg by mouth every other day.           LMP 03/15/2011  Physical Exam  Nursing note and vitals reviewed. Constitutional: She is oriented to person, place, and time. She appears well-developed and well-nourished. No distress.  HENT:  Head: Normocephalic and atraumatic.  Mouth/Throat: Oropharynx is clear and moist.  Eyes: Conjunctivae and EOM are normal.  Neck: Normal range of motion. Neck supple.  Cardiovascular: Normal rate, regular rhythm and normal heart sounds.   Pulmonary/Chest: Effort normal and breath sounds normal. No respiratory distress.  Abdominal: Soft. Bowel sounds are normal. She exhibits no distension.  Musculoskeletal: Normal range of motion. She exhibits no edema.  Neurological: She  is alert and oriented to person, place, and time. No sensory deficit.  Skin: Skin is warm and dry.  Psychiatric: Her speech is normal and behavior is normal. Her mood appears anxious. She exhibits a depressed mood. She expresses suicidal ideation. She expresses no homicidal ideation. She expresses suicidal plans. She expresses no homicidal plans.    ED Course  Procedures (including critical care time) DIAGNOSTIC STUDIES:   COORDINATION OF CARE:   Labs Reviewed  CBC - Abnormal; Notable for the following:    MCH 25.8 (*)    All other components within normal limits  COMPREHENSIVE METABOLIC PANEL - Abnormal; Notable for the following:    Glucose, Bld 107 (*)    Total Bilirubin 0.2 (*)    All other components within normal limits  SALICYLATE LEVEL - Abnormal; Notable for the following:    Salicylate Lvl <2.0 (*)    All other components within normal limits  ACETAMINOPHEN LEVEL  ETHANOL  URINE RAPID DRUG SCREEN (HOSP PERFORMED)   No results found.   1. Anxiety   2. Depression       MDM  Telepsych ordered. [facility] team aware.  9:25 PM Patient requesting to leave. Dr. Fonnie Jarvis came and spoke with patient. She'll be discharged and will followup with her counselor next week.  I personally performed the services described in this documentation, which was scribed in my presence. The recorded information has been reviewed and is accurate.       Trevor Mace, PA-C 09/02/12 2126

## 2012-09-02 NOTE — ED Notes (Signed)
Pt states that she works three jobs and has lots of stress going on in her life at this time, she states that she is currently going through a divorce. She states that she has kept going but the stress, anxiety has been building up inside and finally has made her have the thoughts of harming herself.  Pt is very tearful and states that she would never hurt herself or take herself away form her daughters.

## 2013-01-08 ENCOUNTER — Encounter (HOSPITAL_COMMUNITY): Payer: Self-pay

## 2013-01-09 NOTE — H&P (Signed)
  Patient name  [name], [name] DICTATION#  [id number] CSN# [id number]  Juluis Mire, MD 01/09/2013 4:15 PM

## 2013-01-10 NOTE — H&P (Signed)
[name], [name] NO.:  [id number]  MEDICAL RECORD NO.:  [id number]  LOCATION:                                 FACILITY:  PHYSICIAN:  Juluis Mire, M.D.   DATE OF BIRTH:  Nov 22, 1960  DATE OF ADMISSION:  01/19/2013 DATE OF DISCHARGE:                             HISTORY & PHYSICAL   Of note, her name in epic is [name] still, but she did get married, assume she will change her name when she gets there.  The patient is a 52 year old postmenopausal patient, who was having trouble with postmenopausal bleeding and underwent a saline infusion ultrasound.  She had markedly thickened inhomogeneous endometrium.  It measured 1.6 cm with small cystic areas.  We went ahead and did an endometrial biopsy that revealed weakly proliferative endometrium.  No atypia or hyperplasia but because of the thickness and the cystic areas, she now presents for hysteroscopy with D and C.  PAST HISTORY:  Significant for history of breast cancer.  ALLERGIES:  She is allergic to penicillin.  MEDICATIONS:  She is on alprazolam 0.5 mg at bedtime.  PAST SURGERY HISTORY:  She has had a right breast lumpectomy.  She has had chemo and radiation and is clinically free of disease at the present time.  She has also had laparoscopic evaluation, removal of paratubal cyst.  She has had 3 vaginal deliveries.  FAMILY HISTORY:  Maternal grandmother did have breast cancer.  The patient did have BRCA testing that was negative.  SOCIAL HISTORY:  Reveals no tobacco or alcohol use.  REVIEW OF SYSTEMS:  Noncontributory.  PHYSICAL EXAMINATION:  VITAL SIGNS:  The patient is afebrile, stable vital signs. HEENT:  The patient is normocephalic.  Pupils equal, round, reactive to light and accommodation.  Extraocular movements were intact.  Sclerae and conjunctivae are clear.  Oropharynx clear. BREASTS:  Lumpectomy scar noted in the right breast at the edges of the areola inferiorly.  It is intact.  No  other skin or nipple changes.  No dominant mass, adenopathy, or nipple discharge. LUNGS:  Clear. CARDIOVASCULAR SYSTEM:  Regular rhythm and rate without murmurs or gallops.  No carotid or abdominal bruits. ABDOMEN:  Benign.  No mass, organomegaly, or tenderness. PELVIC:  Normal external genitalia.  Vaginal mucosa reveals a moderate rectocele, associated cystocele, and mild uterine descensus.  Cervix unremarkable.  Uterus normal size, shape, and contour.  Adnexa free of mass or tenderness. EXTREMITIES:  Trace edema. NEUROLOGIC:  Grossly with normal limits.  IMPRESSION: 1. Thickened endometrium with cystic areas, possible hyperplastic     changes. 2. Personal history of breast cancer. 3. Pelvic relaxation.  PLAN:  The patient to undergo dilation and curettage and hysteroscopy with resectoscope.  The risks of surgery have been discussed including the risk of infection.  The risk of hemorrhage that could require transfusion with the risk of AIDS or hepatitis.  Excessive bleeding could require hysterectomy.  There is a risk of perforation with injury to adjacent organs requiring further exploratory surgery.  Risk of deep venous thrombosis and pulmonary embolus.  The patient expressed understanding of indications and risks.     Juluis Mire, M.D.  JSM/MEDQ  D:  01/09/2013  T:  01/10/2013  Job:  [id number]

## 2013-01-19 ENCOUNTER — Ambulatory Visit (HOSPITAL_COMMUNITY)
Admission: RE | Admit: 2013-01-19 | Payer: Commercial Managed Care - PPO | Source: Ambulatory Visit | Admitting: Obstetrics and Gynecology

## 2013-01-19 ENCOUNTER — Encounter (HOSPITAL_COMMUNITY): Admission: RE | Payer: Self-pay | Source: Ambulatory Visit

## 2013-01-19 SURGERY — DILATATION & CURETTAGE/HYSTEROSCOPY WITH RESECTOCOPE
Anesthesia: Choice

## 2013-03-07 ENCOUNTER — Other Ambulatory Visit: Payer: Self-pay | Admitting: Oncology

## 2013-03-07 DIAGNOSIS — Z853 Personal history of malignant neoplasm of breast: Secondary | ICD-10-CM

## 2013-04-16 ENCOUNTER — Other Ambulatory Visit: Payer: Self-pay | Admitting: Obstetrics and Gynecology

## 2013-04-16 DIAGNOSIS — Z853 Personal history of malignant neoplasm of breast: Secondary | ICD-10-CM

## 2013-04-23 ENCOUNTER — Ambulatory Visit
Admission: RE | Admit: 2013-04-23 | Discharge: 2013-04-23 | Disposition: A | Discharge status: Home / Self Care | Payer: Commercial Managed Care - PPO | Source: Ambulatory Visit | Attending: Oncology | Admitting: Oncology

## 2013-04-23 DIAGNOSIS — Z853 Personal history of malignant neoplasm of breast: Secondary | ICD-10-CM

## 2013-05-04 ENCOUNTER — Other Ambulatory Visit: Payer: Self-pay | Admitting: *Deleted

## 2013-05-04 DIAGNOSIS — C50919 Malignant neoplasm of unspecified site of unspecified female breast: Secondary | ICD-10-CM

## 2013-05-07 ENCOUNTER — Other Ambulatory Visit (HOSPITAL_BASED_OUTPATIENT_CLINIC_OR_DEPARTMENT_OTHER): Payer: Commercial Managed Care - PPO | Admitting: Lab

## 2013-05-07 DIAGNOSIS — C50919 Malignant neoplasm of unspecified site of unspecified female breast: Secondary | ICD-10-CM

## 2013-05-07 LAB — CBC WITH DIFFERENTIAL/PLATELET
EOS%: 7.8 % — ABNORMAL HIGH (ref 0.0–7.0)
LYMPH%: 28.7 % (ref 14.0–49.7)
MCH: 25.7 pg (ref 25.1–34.0)
MCHC: 31.8 g/dL (ref 31.5–36.0)
MCV: 80.9 fL (ref 79.5–101.0)
MONO#: 0.5 10*3/uL (ref 0.1–0.9)
MONO%: 7.3 % (ref 0.0–14.0)
NEUT#: 3.7 10*3/uL (ref 1.5–6.5)
RBC: 4.71 10*6/uL (ref 3.70–5.45)
RDW: 13.1 % (ref 11.2–14.5)

## 2013-05-07 LAB — COMPREHENSIVE METABOLIC PANEL (CC13)
AST: 18 U/L (ref 5–34)
Albumin: 3.8 g/dL (ref 3.5–5.0)
Alkaline Phosphatase: 58 U/L (ref 40–150)
CO2: 26 mEq/L (ref 22–29)
Creatinine: 0.8 mg/dL (ref 0.6–1.1)
Glucose: 96 mg/dl (ref 70–140)
Potassium: 4.7 mEq/L (ref 3.5–5.1)
Sodium: 139 mEq/L (ref 136–145)
Total Bilirubin: 0.33 mg/dL (ref 0.20–1.20)
Total Protein: 7.2 g/dL (ref 6.4–8.3)

## 2013-05-09 ENCOUNTER — Encounter: Payer: Self-pay | Admitting: *Deleted

## 2013-05-10 ENCOUNTER — Ambulatory Visit (HOSPITAL_BASED_OUTPATIENT_CLINIC_OR_DEPARTMENT_OTHER): Payer: Commercial Managed Care - PPO | Admitting: Oncology

## 2013-05-10 ENCOUNTER — Encounter (INDEPENDENT_AMBULATORY_CARE_PROVIDER_SITE_OTHER): Payer: Self-pay

## 2013-05-10 VITALS — BP 109/75 | HR 73 | Temp 97.8°F | Resp 18 | Ht 64.0 in | Wt 155.6 lb

## 2013-05-10 DIAGNOSIS — C50919 Malignant neoplasm of unspecified site of unspecified female breast: Secondary | ICD-10-CM

## 2013-05-10 DIAGNOSIS — Z853 Personal history of malignant neoplasm of breast: Secondary | ICD-10-CM

## 2013-05-10 NOTE — Progress Notes (Signed)
ID: [name]   DOB: [birth year]  MR#: [id number]  NGE#:[id number]  PCP: [name], MD GYN: [name] SU:  OTHER MD: [name]  HISTORY OF PRESENT ILLNESS: The patient palpated a thickening in her left breast, and this brought her to bilateral diagnostic mammograms on April 06, 2006.  Interestingly, the left breast showed no mass by either mammogram or ultrasound.  On the right, however, there was a 1.6 cm mass which was not palpable, and which was easily seen by ultrasound.  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  With this information, the patient was referred to Dr. [name], and on April 18, 2006, [name] had bilateral breast MRIs.  These showed only a mass in the right breast, measuring 1.5 cm.  The left breast was clear.    With this information, the patient proceeded to right lumpectomy and sentinel lymph node dissection on April 22, 2006.  The final pathology here [id number]) showed a 4 cm infiltrating ductal carcinoma, grade 2, with initially positive margins, but final negative margins after an additional superior margin was obtained.  There was evidence of lymphovascular invasion, but neither of the two sentinel lymph nodes were involved by tumor. Her subsequent history is as detailed below  INTERVAL HISTORY: [name] returns today for followup of her breast cancer. Since her last visit here she was tested for the BRCA gene and was found to be normal. She also had a dysplastic nevus with moderate to severe atypia removed from the right upper thigh [facility] [phone]) January of 2014, with subsequent excision showing no residual mole.--   The home situation is complex. First her daughter [name], [name]'s husband and their 2 children moved in with her. Then a second daughter moved in with her two children. [name] has moved out  and is now living with her significant other, [name], in [place]. He works for [employer] and the patient describes him as very supportive and a good cook. She is still paying the mortgage on her house. The children do not pay rent, but do help with up keep of the property.   REVIEW OF SYSTEMS: She continues to work very long hours, which are made longer a by having to commute from [place]. She walks a little but does not otherwise exercise regularly. She does admit to a considerable amount of stress. She feels forgetful, depressed, and anxious. She has occasional headaches, which are not more constant or intense than prior. Her last menstrual period was August of 2014 a detailed review of systems was otherwise stable  PAST MEDICAL HISTORY: Past Medical History  Diagnosis Date  . Chest discomfort   . Arm pain, left   . Palpitations   . GERD (gastroesophageal reflux disease)   . IBS (irritable bowel syndrome)   . Hyperlipidemia   . Hemorrhoids, internal   . Schatzki's ring     hx  . Barrett's esophagus   . Breast cancer 52    hx  The past medical history is significant for mitral valve prolapse, requiring antibiotic prophylaxis.  The patient has a history of chronically low MCV, suggestive of thalassemia.  She is status post removal of a left paraovarian cyst.  She has a history of irritable bowel syndrome, a history of GERD, and a history of Barrett's esophagus.    PAST SURGICAL HISTORY: Past Surgical History  Procedure Laterality Date  . Ovarian cyst removal    . Esophagogastroduodenoscopy  01/2004, 02/2007    2006 Short Barrett's and 2008 not identified  . Colonoscopy  01/2004    internal hemorrhoids  . Breast lumpectomy  2007    right, with sentinel node dissection  . Portacath placement  2007    removed   . Hysteroscopy diagnostic  04/06/2011    FAMILY HISTORY Family History  Problem Relation Age of Onset  . Coronary artery disease Neg Hx     No absolute premature coronary  disease  . Esophageal cancer Father 74  . Melanoma Maternal Aunt 60  . Lung cancer Maternal Grandmother 80  . Breast cancer Maternal Grandmother     dx in her 56s  . Diabetes type I Daughter   . Cervical cancer Calderon 10  . Brain cancer Paternal Uncle     dx in his 39s  . Colon cancer Maternal Grandfather     early 45s  . Ovarian cancer Cousin     dx in her 44s  The patient's father died from cancer of the esophagus at the age of 13.  The patient's Calderon, [name], had a history of cervical cancer at the age of 50.  [name], the patient's maternal grandmother had a mastectomy at the age of 31.  She is now 50 years old.  There is no other history of breast or ovarian cancer in the family.   GYNECOLOGIC HISTORY: [name] is GX P3, first pregnancy to term age 27.  She did nurse. She stopped having regular menstrual periods with chemotherapy, but never completely stopped.  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 Her three children are [name], who  works in Furniture conservator/restorer, [name], who is working to be a IT consultant, and [name], who hopes to become eventually a Engineer, civil (consulting).  The patient is a Musician.     ADVANCED DIRECTIVES: not in place  Amber MAINTENANCE: History  Substance Use Topics  . Smoking status: Former Smoker -- 0.50 packs/day for 11 years    Types: Cigarettes    Quit date: 07/12/1985  . Smokeless tobacco: Never Used     Comment: quit 1987  . Alcohol Use: No     Comment: every other night      Colonoscopy:  PAP:  Bone density:  Lipid panel:  Allergies  Allergen Reactions  . Penicillins Hives    CHILDHOOD ALLERGY  . Adhesive [Tape] Rash    And blisters  . Latex Rash    Rash and blisters    Current Outpatient Prescriptions  Medication Sig Dispense Refill  . ALPRAZolam (XANAX) 0.5 MG tablet Take 0.5 mg by mouth at bedtime as needed for anxiety.       [name] ibuprofen (ADVIL,MOTRIN) 200 MG tablet Take 400 mg by mouth  daily as needed for pain (takes about 3 times/week).      . Ibuprofen-Diphenhydramine Cit (ADVIL PM) 200-38 MG TABS Take 1 tablet by mouth at bedtime as needed (sleep).       No current facility-administered medications for this visit.    OBJECTIVE: Middle-aged white woman who appears stated age 52 Vitals:   05/10/13 1014  BP: 109/75  Pulse: 73  Temp: 97.8 F (36.6 C)  Resp: 18     Body mass index is 26.7 kg/(m^2).    ECOG FS: 1  Sclerae unicteric Oropharynx clear No cervical or supraclavicular adenopathy Lungs no rales or rhonchi Heart regular rate  and rhythm Abd soft, nontender, positive bowel sounds MSK no focal spinal tenderness, no upper extremity lymphedema Neuro: nonfocal, well oriented, positive affect Breasts: The right breast is status post lumpectomy and radiation. There is no evidence of local recurrence. The right axilla is benign. Left breast is unremarkable.   LAB RESULTS: Lab Results  Component Value Date   WBC 6.7 05/07/2013   NEUTROABS 3.7 05/07/2013   HGB 12.1 05/07/2013   HCT 38.1 05/07/2013   MCV 80.9 05/07/2013   PLT 279 05/07/2013      Chemistry      Component Value Date/Time   NA 139 05/07/2013 1351   NA 139 09/02/2012 1835   K 4.7 05/07/2013 1351   K 3.8 09/02/2012 1835   CL 101 09/02/2012 1835   CL 104 07/20/2012 0809   CO2 26 05/07/2013 1351   CO2 31 09/02/2012 1835   BUN 12.0 05/07/2013 1351   BUN 11 09/02/2012 1835   CREATININE 0.8 05/07/2013 1351   CREATININE 0.67 09/02/2012 1835      Component Value Date/Time   CALCIUM 9.7 05/07/2013 1351   CALCIUM 9.9 09/02/2012 1835   ALKPHOS 58 05/07/2013 1351   ALKPHOS 59 09/02/2012 1835   AST 18 05/07/2013 1351   AST 18 09/02/2012 1835   ALT 11 05/07/2013 1351   ALT 12 09/02/2012 1835   BILITOT 0.33 05/07/2013 1351   BILITOT 0.2* 09/02/2012 1835       Lab Results  Component Value Date   LABCA2 17 05/05/2012    No components found with this basename: [id number]    No results found for  this basename: INR,  in the last 168 hours  Urinalysis    Component Value Date/Time   LABSPEC 1.020 05/31/2006 1221    STUDIES: EXAM: 04/23/2013 DIGITAL DIAGNOSTIC BILATERAL MAMMOGRAM with CAD  DIGITAL BREAST TOMOSYNTHESIS  Digital breast tomosynthesis images are acquired in two projections.  These images are reviewed in combination with the digital mammogram,  confirming the findings below.  COMPARISON: April 17, 2012, April 15, 2011, April 13, 2010  ACR Breast Density Category b: There are scattered areas of  fibroglandular density.  FINDINGS:  Cc and MLO views of bilateral breasts, spot tangential view of right  breast are submitted. No suspicious abnormalities identified in both  breasts.  Mammographic images were processed with CAD.  IMPRESSION:  Benign findings.  RECOMMENDATION:  Bilateral screening mammogram in 1 year.   ASSESSMENT: 52 y.o. BRCA negative [place] woman status post right lumpectomy and sentinel lymph node biopsy October of 2007 for a T2 N0, grade 2 invasive ductal carcinoma, estrogen and progesterone receptor strongly positive with a low MIB-1 and HER-2 negative.   (1) received cyclophosphamide and docetaxel x4 completed January of 2008 followed by radiation, then was on tamoxifen briefly (May of 2008 to Calderon of 2009).   PLAN:  Ula is doing well from a breast cancer point of view. Despite all the stress, she seems to be making progress in her relations. Perhaps at some point in the near future her children will be able to stand on her own 2 feet and she can move back into her own house or sell it.   I am waiting for her to become fully postmenopausal to start aromatase inhibitors. She will see me again in one year. She knows to call for any problems that may develop before that visit.     Kynzlee Hucker C    05/10/2013

## 2013-05-14 ENCOUNTER — Ambulatory Visit (INDEPENDENT_AMBULATORY_CARE_PROVIDER_SITE_OTHER): Payer: Commercial Managed Care - PPO | Admitting: Internal Medicine

## 2013-05-14 ENCOUNTER — Telehealth: Payer: Self-pay | Admitting: Oncology

## 2013-05-14 ENCOUNTER — Encounter: Payer: Self-pay | Admitting: Internal Medicine

## 2013-05-14 VITALS — BP 98/60 | HR 76 | Ht 64.0 in | Wt 158.0 lb

## 2013-05-14 DIAGNOSIS — K219 Gastro-esophageal reflux disease without esophagitis: Secondary | ICD-10-CM

## 2013-05-14 DIAGNOSIS — R1310 Dysphagia, unspecified: Secondary | ICD-10-CM

## 2013-05-14 DIAGNOSIS — Z1211 Encounter for screening for malignant neoplasm of colon: Secondary | ICD-10-CM

## 2013-05-14 MED ORDER — PANTOPRAZOLE SODIUM 40 MG PO TBEC: 40.0000 mg | DELAYED_RELEASE_TABLET | Freq: Every day | ORAL | Status: DC

## 2013-05-14 MED ORDER — NA SULFATE-K SULFATE-MG SULF 17.5-3.13-1.6 GM/177ML PO SOLN: ORAL | Status: DC

## 2013-05-14 NOTE — Telephone Encounter (Signed)
per 05/10/13 POF rs 1 yr cal mailed to pt shh

## 2013-05-14 NOTE — Patient Instructions (Addendum)
You have been scheduled for an endoscopy and colonoscopy with propofol. Please follow the written instructions given to you at your visit today. Please pick up your prep at the pharmacy within the next 1-3 days. If you use inhalers (even only as needed), please bring them with you on the day of your procedure. Your physician has requested that you go to www.startemmi.com and enter the access code given to you at your visit today. This web site gives a general overview about your procedure. However, you should still follow specific instructions given to you by our office regarding your preparation for the procedure.  We have sent the following medications to your pharmacy for you to pick up at your convenience: Generic protonix   It is the time of year to have a vaccination to prevent the flu (influenza virus).  Please have this done through your primary care provider or you can get this done at local pharmacies or the [facility]. It would be very helpful if you notify your primary care provider when and where you had the vaccination given by messaging them in My Chart, leaving a message or faxing the information.   I appreciate the opportunity to care for you.

## 2013-05-14 NOTE — Progress Notes (Signed)
Subjective:    Patient ID: [name], female    DOB: 01/27/1961, 52 y.o.   MRN: [id number]  HPI The patient is a very nice lady I know from previous workup, she has had intestinal metaplasia in the GE junction area with a question of Barrett's esophagus but it has been inconsistently seen on endoscopy. Her father did have esophageal cancer. She had a colonoscopy for bleeding in 2005 it showed hemorrhoids.  She is back today with complaints of recurrent dysphagia. She says when she eats solid food she is a middle drink afterwards, as it won't go down and then sometimes she has difficulty swallowing liquids alone. She points to the suprasternal area for sticking point. She was on a PPI, was seen in 2012 things improved, she did not follow through with an endoscopy is recommended at that time to followup on the possible Barrett's esophagus. He also has a history of a Schatzki ring.  She is having allergy problems and a started Nasonex this year with improvement though she does have postnasal drip and some lump in her throat or globus symptoms. She's been under a lot of stress, she is trying to help her daughter and son-in-law in their 2 children they moved in and then her other daughter moved in with her 2 daughters because her husband is being stationed in [place] for the year. So the patient herself has moved out of her own now [place] with a friend and rather than commuting 7 minutes to work is now needing 40 minutes to work. She was started on citalopram in the last couple of months but stopped it after one month because of intense burning sensation in her mouth. With some sensation in the chest as well.  Allergies  Allergen Reactions  . Citalopram Hydrobromide Other (See Comments)    Burning mouth  . Penicillins Hives    CHILDHOOD ALLERGY  . Adhesive [Tape] Rash    And blisters  . Latex Rash    Rash and blisters   Outpatient Prescriptions Prior to Visit  Medication Sig Dispense  Refill  . ALPRAZolam (XANAX) 0.5 MG tablet Take 0.5 mg by mouth at bedtime as needed for anxiety.       [name] ibuprofen (ADVIL,MOTRIN) 200 MG tablet Take 400 mg by mouth daily as needed for pain (takes about 3 times/week).      . Ibuprofen-Diphenhydramine Cit (ADVIL PM) 200-38 MG TABS Take 1 tablet by mouth at bedtime as needed (sleep).       No facility-administered medications prior to visit.   Past Medical History  Diagnosis Date  . Chest discomfort   . Arm pain, left   . Palpitations   . GERD (gastroesophageal reflux disease)   . IBS (irritable bowel syndrome)   . Hyperlipidemia   . Hemorrhoids, internal   . Schatzki's ring     hx  . Barrett's esophagus   . Breast cancer 45    hx   Past Surgical History  Procedure Laterality Date  . Ovarian cyst removal    . Esophagogastroduodenoscopy  01/2004, 02/2007    2006 Short Barrett's and 2008 not identified  . Colonoscopy  01/2004    internal hemorrhoids  . Breast lumpectomy Right 2007    with sentinel node dissection  . Portacath placement  2007    removed   . Hysteroscopy diagnostic  04/06/2011   History   Social History  . Marital Status: Legally Separated    Spouse Name: N/A  Number of Children: 3  . Years of Education: N/A   Occupational History  . word processor    Social History Main Topics  . Smoking status: Former Smoker -- 0.50 packs/day for 11 years    Types: Cigarettes    Quit date: 07/12/1985  . Smokeless tobacco: Never Used     Comment: quit 1987  . Alcohol Use: No     Comment: every other night   . Drug Use: No  . Sexual Activity: None   Other Topics Concern  . 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 She sits most of the time. She has three children.   Coffee in AM     Review of Systems As per HPI     Objective:   Physical Exam General:  NAD Eyes:   Anicteric Neck:  Supple, no mass Lungs:   clear Heart:  S1S2 no rubs, murmurs or gallops  Data Reviewed:  2008 EGD, pathology and prior    Assessment & Plan:   1. Dysphagia, unspecified(787.20)   2. GERD (gastroesophageal reflux disease)   3. Special screening for malignant neoplasms, colon

## 2013-05-18 ENCOUNTER — Encounter: Payer: Self-pay | Admitting: Internal Medicine

## 2013-06-15 ENCOUNTER — Other Ambulatory Visit: Payer: Self-pay | Admitting: Internal Medicine

## 2013-06-28 ENCOUNTER — Encounter: Payer: Commercial Managed Care - PPO | Admitting: Internal Medicine

## 2013-07-19 ENCOUNTER — Encounter: Payer: Commercial Managed Care - PPO | Admitting: Internal Medicine

## 2013-10-16 ENCOUNTER — Other Ambulatory Visit: Payer: Self-pay | Admitting: Family Medicine

## 2013-10-16 DIAGNOSIS — J329 Chronic sinusitis, unspecified: Secondary | ICD-10-CM

## 2013-10-17 ENCOUNTER — Ambulatory Visit
Admission: RE | Admit: 2013-10-17 | Discharge: 2013-10-17 | Disposition: A | Discharge status: Home / Self Care | Payer: 59 | Source: Ambulatory Visit | Attending: Family Medicine | Admitting: Family Medicine

## 2013-10-17 DIAGNOSIS — J329 Chronic sinusitis, unspecified: Secondary | ICD-10-CM

## 2013-12-27 ENCOUNTER — Other Ambulatory Visit: Payer: Self-pay | Admitting: Obstetrics and Gynecology

## 2013-12-27 DIAGNOSIS — N63 Unspecified lump in unspecified breast: Secondary | ICD-10-CM

## 2013-12-31 ENCOUNTER — Telehealth: Payer: Self-pay | Admitting: Internal Medicine

## 2013-12-31 NOTE — Telephone Encounter (Signed)
Patient notified of the recommendations.  She has a very high deductible plan and unless absolutely necessary she would like to avoid EGD.  She is scheduled for colon cancer screening and pre-visit

## 2013-12-31 NOTE — Telephone Encounter (Signed)
I don't think there was a recall because she had + Barrett's in 2005 but then not on subsequent 2 EGD's  Father did have esophageal cancer and that has concerned her  It is a gray zone re: does she need another EGD  I think reasonable either way  So:  1) If she wants EGD schedule as double with colonoscopy  - dx Barrett's and dx colon ca screening 2) Otherwise just colonoscopy 3) If she needs to discuss further I could call her at some point

## 2013-12-31 NOTE — Telephone Encounter (Signed)
Pt with hx of Barrett's in 2008. When is she due for EGD recall? She is also due for routine colon screening for her age. Can she be a direct for a double?

## 2014-01-03 ENCOUNTER — Other Ambulatory Visit: Payer: Self-pay | Admitting: Obstetrics and Gynecology

## 2014-01-03 ENCOUNTER — Ambulatory Visit
Admission: RE | Admit: 2014-01-03 | Discharge: 2014-01-03 | Disposition: A | Discharge status: Home / Self Care | Payer: BC Managed Care – PPO | Source: Ambulatory Visit | Attending: Obstetrics and Gynecology | Admitting: Obstetrics and Gynecology

## 2014-01-03 DIAGNOSIS — R2231 Localized swelling, mass and lump, right upper limb: Secondary | ICD-10-CM

## 2014-01-03 DIAGNOSIS — N63 Unspecified lump in unspecified breast: Secondary | ICD-10-CM

## 2014-02-08 ENCOUNTER — Ambulatory Visit (AMBULATORY_SURGERY_CENTER): Payer: Self-pay | Admitting: *Deleted

## 2014-02-08 VITALS — Ht 64.0 in | Wt 157.8 lb

## 2014-02-08 DIAGNOSIS — Z1211 Encounter for screening for malignant neoplasm of colon: Secondary | ICD-10-CM

## 2014-02-08 MED ORDER — NA SULFATE-K SULFATE-MG SULF 17.5-3.13-1.6 GM/177ML PO SOLN: ORAL | Status: DC

## 2014-02-08 NOTE — Progress Notes (Signed)
No allergies to eggs or soy. No problems with anesthesia.  Pt given Emmi instructions for colonoscopy  No oxygen use  No diet drug use  

## 2014-02-12 ENCOUNTER — Encounter: Payer: Self-pay | Admitting: Internal Medicine

## 2014-02-13 NOTE — Addendum Note (Signed)
Addended by: Levonne Spiller on: 02/13/2014 03:04 PM   Modules accepted: Level of Service

## 2014-03-01 ENCOUNTER — Encounter: Payer: 59 | Admitting: Internal Medicine

## 2014-05-02 ENCOUNTER — Other Ambulatory Visit (HOSPITAL_BASED_OUTPATIENT_CLINIC_OR_DEPARTMENT_OTHER): Payer: BC Managed Care – PPO

## 2014-05-02 DIAGNOSIS — Z853 Personal history of malignant neoplasm of breast: Secondary | ICD-10-CM

## 2014-05-02 DIAGNOSIS — C50919 Malignant neoplasm of unspecified site of unspecified female breast: Secondary | ICD-10-CM

## 2014-05-02 LAB — COMPREHENSIVE METABOLIC PANEL (CC13)
ALBUMIN: 4 g/dL (ref 3.5–5.0)
ALT: 16 U/L (ref 0–55)
AST: 19 U/L (ref 5–34)
Alkaline Phosphatase: 53 U/L (ref 40–150)
Anion Gap: 7 mEq/L (ref 3–11)
BUN: 12.9 mg/dL (ref 7.0–26.0)
CALCIUM: 9.9 mg/dL (ref 8.4–10.4)
CHLORIDE: 106 meq/L (ref 98–109)
CO2: 27 mEq/L (ref 22–29)
Creatinine: 0.8 mg/dL (ref 0.6–1.1)
Glucose: 91 mg/dl (ref 70–140)
POTASSIUM: 4.5 meq/L (ref 3.5–5.1)
Sodium: 140 mEq/L (ref 136–145)
Total Bilirubin: 0.45 mg/dL (ref 0.20–1.20)
Total Protein: 7.2 g/dL (ref 6.4–8.3)

## 2014-05-02 LAB — CBC WITH DIFFERENTIAL/PLATELET
BASO%: 1.8 % (ref 0.0–2.0)
BASOS ABS: 0.1 10*3/uL (ref 0.0–0.1)
EOS%: 7.9 % — AB (ref 0.0–7.0)
Eosinophils Absolute: 0.4 10*3/uL (ref 0.0–0.5)
HCT: 39.2 % (ref 34.8–46.6)
HGB: 12.5 g/dL (ref 11.6–15.9)
LYMPH#: 1.4 10*3/uL (ref 0.9–3.3)
LYMPH%: 26.9 % (ref 14.0–49.7)
MCH: 25.7 pg (ref 25.1–34.0)
MCHC: 31.8 g/dL (ref 31.5–36.0)
MCV: 80.6 fL (ref 79.5–101.0)
MONO#: 0.4 10*3/uL (ref 0.1–0.9)
MONO%: 7.6 % (ref 0.0–14.0)
NEUT#: 2.8 10*3/uL (ref 1.5–6.5)
NEUT%: 55.8 % (ref 38.4–76.8)
Platelets: 313 10*3/uL (ref 145–400)
RBC: 4.86 10*6/uL (ref 3.70–5.45)
RDW: 13.1 % (ref 11.2–14.5)
WBC: 5.1 10*3/uL (ref 3.9–10.3)

## 2014-05-09 ENCOUNTER — Other Ambulatory Visit: Payer: Commercial Managed Care - PPO

## 2014-05-09 ENCOUNTER — Telehealth: Payer: Self-pay | Admitting: Oncology

## 2014-05-09 ENCOUNTER — Ambulatory Visit (HOSPITAL_BASED_OUTPATIENT_CLINIC_OR_DEPARTMENT_OTHER): Payer: BC Managed Care – PPO | Admitting: Oncology

## 2014-05-09 VITALS — BP 118/74 | HR 76 | Temp 98.3°F | Resp 18 | Ht 64.0 in | Wt 155.8 lb

## 2014-05-09 DIAGNOSIS — C50911 Malignant neoplasm of unspecified site of right female breast: Secondary | ICD-10-CM

## 2014-05-09 DIAGNOSIS — Z853 Personal history of malignant neoplasm of breast: Secondary | ICD-10-CM

## 2014-05-09 DIAGNOSIS — K2270 Barrett's esophagus without dysplasia: Secondary | ICD-10-CM

## 2014-05-09 DIAGNOSIS — K589 Irritable bowel syndrome without diarrhea: Secondary | ICD-10-CM

## 2014-05-09 NOTE — Progress Notes (Signed)
ID: Comer Locket   DOB: 1961-03-10  MR#: [id number]  UXN#:[id number]  PCP: Vena Austria, MD GYN: Arvella Nigh SU:  OTHER MD: Silvano Rusk  HISTORY OF PRESENT ILLNESS: The patient palpated a thickening in her left breast, and this brought her to bilateral diagnostic mammograms on April 06, 2006.  Interestingly, the left breast showed no mass by either mammogram or ultrasound.  On the right, however, there was a 1.6 cm mass which was not palpable, and which was easily seen by ultrasound.  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  With this information, the patient was referred to Dr. Annamaria Boots, and on April 18, 2006, Patrici had bilateral breast MRIs.  These showed only a mass in the right breast, measuring 1.5 cm.  The left breast was clear.    With this information, the patient proceeded to right lumpectomy and sentinel lymph node dissection on April 22, 2006.  The final pathology here [id number]) showed a 4 cm infiltrating ductal carcinoma, grade 2, with initially positive margins, but final negative margins after an additional superior margin was obtained.  There was evidence of lymphovascular invasion, but neither of the two sentinel lymph nodes were involved by tumor. Her subsequent history is as detailed below  INTERVAL HISTORY: Nichoel returns today for followup of her breast cancer. The interval history is generally unremarkable. She has 4 grandchildren, 3 granddaughters and a grandson. The daughter living with her at home is going to be moving to [place] with her [employer] husband in December and they will be there for 3 years. This means loses can have the house all to herself and she wonders if she should sell it. However she really likes the house. She has 2 dogs. It is "her home". She likely will stay there. She has also  changed jobs as noted in the social history  REVIEW OF SYSTEMS: She is having back pain, which is being treated through Dr. Darden Dates office. She greatly benefited from a steroid shot recently. She has pain in her neck and shoulders as well. She is very stiff at work. She is going to try massage this weekend and see if that helps things. She feels forgetful and anxious. Occasionally she feels a little depressed. She is just beginning to have hot flashes however she had a period in July of this year. A detailed review of systems today was otherwise stable  PAST MEDICAL HISTORY: Past Medical History  Diagnosis Date  . Chest discomfort   . Arm pain, left   . Palpitations   . GERD (gastroesophageal reflux disease)   . IBS (irritable bowel syndrome)   . Hyperlipidemia   . Hemorrhoids, internal   . Schatzki's ring     hx  . Barrett's esophagus   . Breast cancer 45    hx  . Anxiety   The past medical history is significant for mitral valve prolapse, requiring antibiotic prophylaxis.  The patient has a history of chronically low MCV, suggestive of thalassemia.  She is status post removal of a left paraovarian cyst.  She has a history of irritable bowel syndrome, a history of GERD, and a history of Barrett's esophagus.    PAST SURGICAL HISTORY: Past Surgical History  Procedure Laterality Date  . Ovarian cyst removal    . Esophagogastroduodenoscopy  01/2004, 02/2007    2006 Short  Barrett's and 2008 not identified  . Colonoscopy  01/2004    internal hemorrhoids  . Breast lumpectomy Right 2007    with sentinel node dissection  . Portacath placement  2007    removed   . Hysteroscopy diagnostic  04/06/2011    FAMILY HISTORY Family History  Problem Relation Age of Onset  . Coronary artery disease Neg Hx     No absolute premature coronary disease  . Esophageal cancer Father 63  . Melanoma Maternal Aunt 60  . Lung cancer Maternal Grandmother 80  . Breast cancer Maternal Grandmother     dx in  her 64s  . Diabetes type I Daughter   . Cervical cancer Mother 28  . Brain cancer Paternal Uncle     dx in his 58s  . Colon cancer Maternal Grandfather     early 11s  . Ovarian cancer Cousin     dx in her 60s  The patient's father died from cancer of the esophagus at the age of 35.  The patient's mother, Jeannene Patella, had a history of cervical cancer at the age of 49.  Pam's mother, the patient's maternal grandmother had a mastectomy at the age of 63.  She is now 52 years old.  There is no other history of breast or ovarian cancer in the family.   GYNECOLOGIC HISTORY: Nehal is [place] P3, first pregnancy to term age 53.  She did nurse. She stopped having regular menstrual periods with chemotherapy, but never completely stopped.  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 She likes the hours but the pain is adequate only and the insurance is expensive.Her three children are Jarrett Soho, who  works in Occupational hygienist, Barista, who is working to be a Radio broadcast assistant, and Careers information officer, who hopes to become eventually a Marine scientist. The patient has 4 grandchildren The patient is a Educational psychologist.     ADVANCED DIRECTIVES: not in place  HEALTH MAINTENANCE: History  Substance Use Topics  . Smoking status: Former Smoker -- 0.50 packs/day for 11 years    Types: Cigarettes    Quit date: 07/12/1985  . Smokeless tobacco: Never Used     Comment: quit 1987  . Alcohol Use: 1.2 oz/week    2 Glasses of wine per week     Comment: every other night      Colonoscopy:  PAP:  Bone density:  Lipid panel:  Allergies  Allergen Reactions  . Citalopram Hydrobromide Other (See Comments)    Burning mouth  . Penicillins Hives    CHILDHOOD ALLERGY  . Adhesive [Tape] Rash    And blisters  . Latex Rash    Rash and blisters    Current Outpatient Prescriptions  Medication Sig Dispense Refill  . ALPRAZolam (XANAX) 0.5 MG tablet Take 0.5 mg by  mouth at bedtime as needed for anxiety.       . diphenhydramine-acetaminophen (TYLENOL PM) 25-500 MG TABS Take 1 tablet by mouth at bedtime as needed.      . famotidine (PEPCID AC) 10 MG chewable tablet Chew 10 mg by mouth as needed for heartburn.      [name] ibuprofen (ADVIL,MOTRIN) 200 MG tablet Take 400 mg by mouth daily as needed for pain (takes about 3 times/week).      . mometasone (NASONEX) 50 MCG/ACT nasal spray Place 2 sprays into the nose daily.      . Na Sulfate-K Sulfate-Mg Sulf (SUPREP BOWEL PREP) SOLN  suprep as directed.  No substitutions  354 mL  0   No current facility-administered medications for this visit.    OBJECTIVE: Middle-aged white woman in no acute distress Filed Vitals:   05/09/14 1035  BP: 118/74  Pulse: 76  Temp: 98.3 F (36.8 C)  Resp: 18     Body mass index is 26.73 kg/(m^2).    ECOG FS: 1  Sclerae unicteric, pupils round and equal Oropharynx clear, teeth in good repair No cervical or supraclavicular adenopathy Lungs no rales or rhonchi Heart regular rate and rhythm Abd soft, nontender, positive bowel sounds MSK no focal spinal tenderness, no upper extremity lymphedema Neuro: nonfocal, well oriented, positive affect Breasts: The right breast is status post lumpectomy and radiation. There is no evidence of local recurrence. The right axilla is benign. Left breast is unremarkable.   LAB RESULTS: Lab Results  Component Value Date   WBC 5.1 05/02/2014   NEUTROABS 2.8 05/02/2014   HGB 12.5 05/02/2014   HCT 39.2 05/02/2014   MCV 80.6 05/02/2014   PLT 313 05/02/2014      Chemistry      Component Value Date/Time   NA 140 05/02/2014 0829   NA 139 09/02/2012 1835   K 4.5 05/02/2014 0829   K 3.8 09/02/2012 1835   CL 101 09/02/2012 1835   CL 104 07/20/2012 0809   CO2 27 05/02/2014 0829   CO2 31 09/02/2012 1835   BUN 12.9 05/02/2014 0829   BUN 11 09/02/2012 1835   CREATININE 0.8 05/02/2014 0829   CREATININE 0.67 09/02/2012 1835      Component Value  Date/Time   CALCIUM 9.9 05/02/2014 0829   CALCIUM 9.9 09/02/2012 1835   ALKPHOS 53 05/02/2014 0829   ALKPHOS 59 09/02/2012 1835   AST 19 05/02/2014 0829   AST 18 09/02/2012 1835   ALT 16 05/02/2014 0829   ALT 12 09/02/2012 1835   BILITOT 0.45 05/02/2014 0829   BILITOT 0.2* 09/02/2012 1835       Lab Results  Component Value Date   LABCA2 17 05/05/2012    No components found with this basename: [id number]    No results found for this basename: INR,  in the last 168 hours  Urinalysis    Component Value Date/Time   LABSPEC 1.020 05/31/2006 1221    STUDIES: CLINICAL DATA: Right lumpectomy 2007. Patient indicates to me she  is feeling fullness in her right axilla. She also indicates  referring physician palpated a lump in the right axilla.  EXAM:  DIGITAL DIAGNOSTIC BILATERAL MAMMOGRAM WITH CAD  ULTRASOUND RIGHT BREAST  COMPARISON: 04/23/2013, 04/17/2012, 04/15/2011  ACR Breast Density Category b: There are scattered areas of  fibroglandular density.  FINDINGS:  Left breast is unchanged and negative. On the right the other is  subareolar postsurgical scar. There is no change in this appearance.  Visualized portion of the right axilla is negative.  Mammographic images were processed with CAD.  On physical exam, there is no lump appreciated in the right axilla.  Ultrasound is performed, showing no abnormalities in the right  axilla.  IMPRESSION:  Stable benign postoperative appearance on the right. No suspicious  findings.  RECOMMENDATION:  Screening bilateral mammogram in 1 year  I have discussed the findings and recommendations with the patient.  Results were also provided in writing at the conclusion of the  visit. If applicable, a reminder letter will be sent to the patient  regarding the next appointment.  BI-RADS CATEGORY 2: Benign.  Electronically  Signed  By: Skipper Cliche M.D.  On: 01/03/2014 15:38   ASSESSMENT: 53 y.o. BRCA negative [place] woman status  post right lumpectomy and sentinel lymph node biopsy October of 2007 for a T2 N0, grade 2 invasive ductal carcinoma, estrogen and progesterone receptor strongly positive with a low MIB-1 and HER-2 negative.   (1) received cyclophosphamide and docetaxel x4 completed January of 2008 followed by radiation, then was on tamoxifen briefly (May of 2008 to November of 2009).   PLAN:  Because this is a preventative visit more than anything else we discussed several issues including the fact that she is not exercising regularly. She does do quite a bit of walking in her job. I gave her a copy of the [employer] and explained how it works. Hopefully she will take advantage of that opportunity.  She is very hesitant to consider aromatase inhibitors. Today I gave her information on menopausal symptoms, and how they are affected by aromatase inhibitors. She is very reluctant about any stress to her life at present. It could be that by next year thinks will be sufficiently stable at she might consider therapy.  She has a good understanding of the overall plan. She agrees with it. She knows a goal of our interventions in her case is cure. She will call with any problems that may develop before her next visit here.     Donnel Venuto C    05/09/2014

## 2014-05-09 NOTE — Telephone Encounter (Signed)
per pof to sch pt appt-gave pt copy of sch °

## 2014-12-30 ENCOUNTER — Other Ambulatory Visit: Payer: Self-pay | Admitting: Obstetrics and Gynecology

## 2014-12-31 LAB — CYTOLOGY - PAP

## 2015-03-21 ENCOUNTER — Other Ambulatory Visit: Payer: Self-pay | Admitting: Physician Assistant

## 2015-03-21 DIAGNOSIS — H938X1 Other specified disorders of right ear: Secondary | ICD-10-CM

## 2015-03-21 DIAGNOSIS — H9311 Tinnitus, right ear: Secondary | ICD-10-CM

## 2015-04-01 ENCOUNTER — Other Ambulatory Visit: Payer: Self-pay

## 2015-05-02 ENCOUNTER — Other Ambulatory Visit: Payer: Self-pay

## 2015-05-02 DIAGNOSIS — C50911 Malignant neoplasm of unspecified site of right female breast: Secondary | ICD-10-CM

## 2015-05-05 ENCOUNTER — Other Ambulatory Visit: Payer: Self-pay

## 2015-05-08 ENCOUNTER — Telehealth: Payer: Self-pay | Admitting: Oncology

## 2015-05-08 NOTE — Telephone Encounter (Signed)
Patient called to cx 10/28 appointment. Per patient she will not be rescheduling at this time.

## 2015-05-12 ENCOUNTER — Ambulatory Visit: Payer: BC Managed Care – PPO | Admitting: Oncology

## 2015-05-26 ENCOUNTER — Other Ambulatory Visit: Payer: Self-pay

## 2015-05-26 DIAGNOSIS — Z1231 Encounter for screening mammogram for malignant neoplasm of breast: Secondary | ICD-10-CM

## 2015-06-19 ENCOUNTER — Ambulatory Visit
Admission: RE | Admit: 2015-06-19 | Discharge: 2015-06-19 | Disposition: A | Discharge status: Home / Self Care | Payer: 59 | Source: Ambulatory Visit

## 2015-06-19 DIAGNOSIS — Z1231 Encounter for screening mammogram for malignant neoplasm of breast: Secondary | ICD-10-CM

## 2016-05-14 ENCOUNTER — Other Ambulatory Visit: Payer: Self-pay | Admitting: Obstetrics and Gynecology

## 2016-05-14 DIAGNOSIS — Z1231 Encounter for screening mammogram for malignant neoplasm of breast: Secondary | ICD-10-CM

## 2016-06-21 ENCOUNTER — Ambulatory Visit
Admission: RE | Admit: 2016-06-21 | Discharge: 2016-06-21 | Disposition: A | Discharge status: Home / Self Care | Payer: 59 | Source: Ambulatory Visit | Attending: Obstetrics and Gynecology | Admitting: Obstetrics and Gynecology

## 2016-06-21 DIAGNOSIS — Z1231 Encounter for screening mammogram for malignant neoplasm of breast: Secondary | ICD-10-CM

## 2017-02-04 ENCOUNTER — Encounter (INDEPENDENT_AMBULATORY_CARE_PROVIDER_SITE_OTHER): Payer: Self-pay

## 2017-02-04 ENCOUNTER — Encounter: Payer: Self-pay | Admitting: Physician Assistant

## 2017-02-04 ENCOUNTER — Ambulatory Visit (INDEPENDENT_AMBULATORY_CARE_PROVIDER_SITE_OTHER): Payer: 59 | Admitting: Physician Assistant

## 2017-02-04 VITALS — BP 110/68 | HR 76 | Ht 64.0 in | Wt 126.0 lb

## 2017-02-04 DIAGNOSIS — R1319 Other dysphagia: Secondary | ICD-10-CM | POA: Diagnosis not present

## 2017-02-04 DIAGNOSIS — Z1211 Encounter for screening for malignant neoplasm of colon: Secondary | ICD-10-CM

## 2017-02-04 DIAGNOSIS — K219 Gastro-esophageal reflux disease without esophagitis: Secondary | ICD-10-CM

## 2017-02-04 MED ORDER — PANTOPRAZOLE SODIUM 40 MG PO TBEC: DELAYED_RELEASE_TABLET | ORAL | 3 refills | Status: DC

## 2017-02-04 NOTE — Progress Notes (Signed)
Subjective:    Patient ID: [name], female    DOB: [birth year], 56 y.o.   MRN: [id number]  HPI [name] is a pleasant 56 year old white female known to Dr. Carlean Purl. She was last seen in our office in 2014. She comes in today with concerns for worsening reflux symptoms. She has history of breast cancer, hyperlipidemia and IBS. Patient had Barrett's esophagus documented on remote EGD but most recent endoscopy in 2008 with biopsy showed no evidence of intestinal metaplasia. She does have family history of esophageal cancer in her father. Patient also had screening colonoscopy in 2005 which was negative with the exception of internal hemorrhoids. When seen in 2014 she was scheduled for endoscopy and colonoscopy but did not follow through with these due to a very high deductible. Patient says her current symptoms have been bad over the past month area and she's not been on regular acid suppression but had tried Zantac in the recent past which caused some diarrhea. She has started OTC Pepcid which she has not found very effective. She is currently having daily problems with heartburn indigestion and a "sour stomach". She does get occasional episodes of what she describes as a spasm with food transiently feeling as if it sticking. This occurs very infrequently. She denies any abdominal pain or recent changes in bowel habits. She also mentions that she's had a burning sensation or scalded feeling on  the back of her tongue and into her throat over the past 1 year.  Review of Systems Pertinent positive and negative review of systems were noted in the above HPI section.  All other review of systems was otherwise negative.  Outpatient Encounter Prescriptions as of 02/04/2017  Medication Sig  . ALPRAZolam (XANAX) 0.5 MG tablet Take 0.5 mg by mouth at bedtime as needed for anxiety.   . diphenhydramine-acetaminophen (TYLENOL PM) 25-500 MG TABS Take 1 tablet by mouth at bedtime as needed.  . famotidine  (PEPCID AC) 10 MG chewable tablet Chew 10 mg by mouth as needed for heartburn.  . fluticasone (FLONASE) 50 MCG/ACT nasal spray Place 2 sprays into both nostrils daily.  [name] ibuprofen (ADVIL,MOTRIN) 200 MG tablet Take 400 mg by mouth daily as needed for pain (takes about 3 times/week).  . pantoprazole (PROTONIX) 40 MG tablet Take 1 tab by mouth every morning.  . [DISCONTINUED] mometasone (NASONEX) 50 MCG/ACT nasal spray Place 2 sprays into the nose daily.  . [DISCONTINUED] Na Sulfate-K Sulfate-Mg Sulf (SUPREP BOWEL PREP) SOLN suprep as directed.  No substitutions   No facility-administered encounter medications on file as of 02/04/2017.    Allergies  Allergen Reactions  . Citalopram Hydrobromide Other (See Comments)    Burning mouth  . Penicillins Hives    CHILDHOOD ALLERGY  . Adhesive [Tape] Rash    And blisters  . Latex Rash    Rash and blisters   Patient Active Problem List   Diagnosis Date Noted  . Breast cancer, right breast ([place]) 05/09/2014  . HYPERLIPIDEMIA 05/08/2007  . HEMORRHOIDS, INTERNAL 05/08/2007  . GERD 05/08/2007  . Barrett's esophagus 05/08/2007  . Irritable bowel syndrome 05/08/2007  . BREAST CANCER, HX OF 05/08/2007   Social History   Social History  . Marital status: Divorced    Spouse name: N/A  . Number of children: 3  . Years of education: N/A   Occupational History  . legal assistant     [name]   Social History Main Topics  . Smoking status: Former Smoker  Packs/day: 0.50    Years: 11.00    Types: Cigarettes    Quit date: 07/12/1985  . Smokeless tobacco: Never Used     Comment: quit 1987  . Alcohol use 1.2 oz/week    2 Glasses of wine per week     Comment: every other night   . Drug use: No  . Sexual activity: Not on file   Other Topics Concern  . 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 She sits most of the time. She has three children.   Coffee in AM    [name]'s family  history includes Brain cancer in her paternal uncle; Breast cancer in her maternal grandmother; Cervical cancer (age of onset: 31) in her mother; Colon cancer in her maternal grandfather; Diabetes type I in her daughter; Esophageal cancer (age of onset: 37) in her father; Lung cancer (age of onset: 45) in her maternal grandmother; Melanoma (age of onset: 61) in her maternal aunt; Ovarian cancer in her cousin.      Objective:    Vitals:   02/04/17 1041  BP: 110/68  Pulse: 76    Physical Exam   well-developed white female in no acute distress, pleasant blood pressure 110/68 pulse 76, height 5 foot 4, weight 126, BMI 21.6. HEENT; nontraumatic , normocephalic EOMI PERRLA sclera anicteric, Cardiovascular; regular rate and rhythm with S1-S2 no murmur or gallop, Pulmonary ;clear bilaterally, Abdomen; soft, nontender nondistended bowel sounds are active there is no palpable mass or hepatosplenomegaly, Rectal ;exam not done, Ext; no clubbing cyanosis or edema skin warm and dry, Neuropsych ;mood and affect appropriate       Assessment & Plan:   #72  56 year old white female with history of chronic GERD, remote history of Barrett's, with last EGD 2008 showing no evidence of intestinal metaplasia on esophageal biopsies. Patient presenting now with worsening GERD symptoms despite H2 blocker therapy recently #2 colon cancer surveillance-due for colonoscopy, negative colonoscopy 2005 #3 family history of esophageal cancer in patient's father #4 personal history of breast cancer #5 burning sensation on tongue chronic 1 year  Plan; Patient will be scheduled for EGD and colonoscopy with Dr. Carlean Purl. Both procedures were discussed in detail with the patient including risks and benefits and she is agreeable to proceed Start antireflux regimen Start Protonix 40 mg by mouth every morning ac  breakfast  Amy S Esterwood PA-C 02/04/2017   Cc: Maury Dus, MD

## 2017-02-04 NOTE — Patient Instructions (Signed)
You have been scheduled for a colonoscopy /Endoscopy. Please follow written instructions given to you at your visit today.  Please pick up your prep supplies at the pharmacy within the next 1-3 days. If you use inhalers (even only as needed), please bring them with you on the day of your procedure. Your physician has requested that you go to www.startemmi.com and enter the access code given to you at your visit today. This web site gives a general overview about your procedure. However, you should still follow specific instructions given to you by our office regarding your preparation for the procedure.  We have provided you with an  antireflux regimen.

## 2017-02-14 ENCOUNTER — Encounter (HOSPITAL_COMMUNITY): Payer: Self-pay

## 2017-02-14 ENCOUNTER — Other Ambulatory Visit: Payer: Self-pay

## 2017-02-14 ENCOUNTER — Emergency Department (HOSPITAL_COMMUNITY): Payer: 59

## 2017-02-14 ENCOUNTER — Emergency Department (HOSPITAL_COMMUNITY)
Admission: EM | Admit: 2017-02-14 | Discharge: 2017-02-14 | Discharge status: Against Medical Advice | Payer: 59 | Attending: Emergency Medicine | Admitting: Emergency Medicine

## 2017-02-14 DIAGNOSIS — R110 Nausea: Secondary | ICD-10-CM | POA: Insufficient documentation

## 2017-02-14 DIAGNOSIS — Z5321 Procedure and treatment not carried out due to patient leaving prior to being seen by health care provider: Secondary | ICD-10-CM | POA: Insufficient documentation

## 2017-02-14 DIAGNOSIS — R0602 Shortness of breath: Secondary | ICD-10-CM | POA: Diagnosis not present

## 2017-02-14 DIAGNOSIS — M546 Pain in thoracic spine: Secondary | ICD-10-CM | POA: Insufficient documentation

## 2017-02-14 LAB — CBC
HCT: 37 % (ref 36.0–46.0)
Hemoglobin: 11.9 g/dL — ABNORMAL LOW (ref 12.0–15.0)
MCH: 26.2 pg (ref 26.0–34.0)
MCHC: 32.2 g/dL (ref 30.0–36.0)
MCV: 81.5 fL (ref 78.0–100.0)
PLATELETS: 274 10*3/uL (ref 150–400)
RBC: 4.54 MIL/uL (ref 3.87–5.11)
RDW: 13 % (ref 11.5–15.5)
WBC: 4.4 10*3/uL (ref 4.0–10.5)

## 2017-02-14 LAB — I-STAT TROPONIN, ED: Troponin i, poc: 0 ng/mL (ref 0.00–0.08)

## 2017-02-14 LAB — BASIC METABOLIC PANEL
Anion gap: 8 (ref 5–15)
BUN: 10 mg/dL (ref 6–20)
CHLORIDE: 103 mmol/L (ref 101–111)
CO2: 28 mmol/L (ref 22–32)
CREATININE: 0.78 mg/dL (ref 0.44–1.00)
Calcium: 9.9 mg/dL (ref 8.9–10.3)
GFR calc Af Amer: >60 mL/min (ref >60)
GFR calc non Af Amer: >60 mL/min (ref >60)
GLUCOSE: 118 mg/dL — AB (ref 65–99)
Potassium: 4.6 mmol/L (ref 3.5–5.1)
Sodium: 139 mmol/L (ref 135–145)

## 2017-02-14 NOTE — ED Notes (Signed)
Patient inquired about wait time and results. RN stated we could not interpret results and updated her on status/wait time. Patient states she is feeling much better and would rather follow up with her PCP tomorrow. Patient ambulatory with steady gait, NAD noted.

## 2017-02-14 NOTE — ED Triage Notes (Signed)
PT reports upper thoracic back pain that radiates to left arm x 3 days. This morning pt woke up with SOB, nausea, and dizziness. Denies chest pain. PT denies having cardiac hx or hx of back pain. NAD. VSS

## 2017-03-09 ENCOUNTER — Ambulatory Visit: Payer: 59 | Admitting: Internal Medicine

## 2017-03-29 ENCOUNTER — Encounter: Payer: 59 | Admitting: Internal Medicine

## 2017-04-05 ENCOUNTER — Telehealth: Payer: Self-pay | Admitting: Internal Medicine

## 2017-04-05 ENCOUNTER — Other Ambulatory Visit: Payer: Self-pay

## 2017-04-05 DIAGNOSIS — R1011 Right upper quadrant pain: Secondary | ICD-10-CM

## 2017-04-05 DIAGNOSIS — R142 Eructation: Secondary | ICD-10-CM

## 2017-04-05 MED ORDER — FAMOTIDINE 40 MG PO TABS: 40.0000 mg | ORAL_TABLET | Freq: Two times a day (BID) | ORAL | 3 refills | Status: DC

## 2017-04-05 NOTE — Telephone Encounter (Signed)
Left message to call back  

## 2017-04-05 NOTE — Progress Notes (Signed)
Patient is advised and agrees to this plan. 

## 2017-04-05 NOTE — Telephone Encounter (Signed)
Increase pepcid to 40 mg po BID  Can send as RX,, and schedule for upper abdominal US to r/o GB disease

## 2017-04-05 NOTE — Telephone Encounter (Signed)
U/S at [facility] 04/12/17 arrive at 7:15 am for a 7:30 am. NPO after midnight. Medication to [employer].

## 2017-04-05 NOTE — Telephone Encounter (Signed)
Complains of a pressure pain that wraps across her mid to upper back. If she can burp, it relieves some of the pressure. The symptoms are associated with eating certain foods, like popcorn. She stopped taking the Protonix after 1 week. It was causing h/a's and dizziness. She has been taking Pepcid 20 mg for about 3 weeks. The new symptoms started 1 week ago. Her endo/colon is 04/25/17. What do you recommend?

## 2017-04-07 ENCOUNTER — Telehealth: Payer: Self-pay | Admitting: Physician Assistant

## 2017-04-07 NOTE — Telephone Encounter (Signed)
Reports she has begun the new Pepcid dosing of 40 mg BID. She has had 3 doses to date. This morning she ate a banana and had some coffee. She had a symptom free morning. Lunch she ate salad, grilled vegetables and small piece of lean steak. She drinks water. Her pain developed soon after lunch. She struggles to describe it. Mid sternal wrapping around to her back. Very intense. Makes herself burp and this helps. Had a normal bowel movement which after that the pain is completely gone. Her abd u/s is 04/12/17. Egd/Colon is 04/13/17.

## 2017-04-08 ENCOUNTER — Other Ambulatory Visit: Payer: Self-pay

## 2017-04-08 ENCOUNTER — Telehealth: Payer: Self-pay | Admitting: Physician Assistant

## 2017-04-08 MED ORDER — HYOSCYAMINE SULFATE 0.125 MG SL SUBL: 0.1250 mg | SUBLINGUAL_TABLET | SUBLINGUAL | 1 refills | Status: DC | PRN

## 2017-04-08 NOTE — Telephone Encounter (Signed)
Can offer levsin SL q  4-6 hours prn for episodes of pain,#40/1 continue pepcid and wait for pending tests  Next week

## 2017-04-08 NOTE — Telephone Encounter (Signed)
Please send other telephone message

## 2017-04-08 NOTE — Telephone Encounter (Signed)
Patient felt like she had a fever. She had eaten yogurt and the pain started. Then she had chills and sweats. Took Tylenol. Feels better now. Still no vomiting, no tarry or dark stools. Continuing Pepcid as ordered  Agrees to try Levsin SL. Unsure of insurance coverage. Send Rx to [facility].

## 2017-04-12 ENCOUNTER — Ambulatory Visit (HOSPITAL_COMMUNITY): Payer: 59

## 2017-04-13 ENCOUNTER — Ambulatory Visit (AMBULATORY_SURGERY_CENTER): Payer: BLUE CROSS/BLUE SHIELD | Admitting: Internal Medicine

## 2017-04-13 ENCOUNTER — Encounter: Payer: Self-pay | Admitting: Internal Medicine

## 2017-04-13 VITALS — BP 139/79 | HR 64 | Temp 98.9°F | Resp 15 | Ht 64.0 in | Wt 126.0 lb

## 2017-04-13 DIAGNOSIS — Z1211 Encounter for screening for malignant neoplasm of colon: Secondary | ICD-10-CM

## 2017-04-13 DIAGNOSIS — K2210 Ulcer of esophagus without bleeding: Secondary | ICD-10-CM

## 2017-04-13 DIAGNOSIS — R1310 Dysphagia, unspecified: Secondary | ICD-10-CM | POA: Diagnosis not present

## 2017-04-13 DIAGNOSIS — K219 Gastro-esophageal reflux disease without esophagitis: Secondary | ICD-10-CM | POA: Diagnosis not present

## 2017-04-13 MED ORDER — ESOMEPRAZOLE MAGNESIUM 40 MG PO CPDR: 40.0000 mg | DELAYED_RELEASE_CAPSULE | Freq: Every day | ORAL | 2 refills | Status: DC

## 2017-04-13 MED ORDER — SODIUM CHLORIDE 0.9 % IV SOLN: 500.0000 mL | INTRAVENOUS | Status: DC

## 2017-04-13 NOTE — Op Note (Signed)
[facility] Patient Name: [name] Procedure Date: 04/13/2017 3:02 PM MRN: [id number] Endoscopist: Gatha Mayer , MD Age: 56 Referring MD:  Date of Birth: 04-08-1961 Gender: Female Account #: [id number] Procedure:                Upper GI endoscopy Indications:              Odynophagia, Heartburn Medicines:                Propofol per Anesthesia, Monitored Anesthesia Care Procedure:                Pre-Anesthesia Assessment:                           - Prior to the procedure, a History and Physical                            was performed, and patient medications and                            allergies were reviewed. The patient's tolerance of                            previous anesthesia was also reviewed. The risks                            and benefits of the procedure and the sedation                            options and risks were discussed with the patient.                            All questions were answered, and informed consent                            was obtained. Prior Anticoagulants: The patient has                            taken no previous anticoagulant or antiplatelet                            agents. ASA Grade Assessment: II - A patient with                            mild systemic disease. After reviewing the risks                            and benefits, the patient was deemed in                            satisfactory condition to undergo the procedure.                           After obtaining informed consent, the endoscope was  passed under direct vision. Throughout the                            procedure, the patient's blood pressure, pulse, and                            oxygen saturations were monitored continuously. The                            Model GIF-HQ190 [id number]) scope was introduced                            through the mouth, and advanced to the second part                            of  duodenum. The upper GI endoscopy was                            accomplished without difficulty. The patient                            tolerated the procedure well. Scope In: Scope Out: Findings:                 LA Grade A (one or more mucosal breaks less than 5                            mm, not extending between tops of 2 mucosal folds)                            esophagitis with no bleeding was found in the                            distal esophagus. Biopsies were taken with a cold                            forceps for histology. Verification of patient                            identification for the specimen was done. Estimated                            blood loss was minimal.                           Mucosal changes including feline appearance were                            found in the upper third of the esophagus, in the                            middle third of the esophagus and in the lower  third of the esophagus. Biopsies were obtained from                            the proximal and distal esophagus with cold forceps                            for histology of suspected eosinophilic                            esophagitis. Verification of patient identification                            for the specimen was done. Estimated blood loss was                            minimal.                           The cardia and gastric fundus were normal on                            retroflexion.                           The exam was otherwise without abnormality. Complications:            No immediate complications. Estimated Blood Loss:     Estimated blood loss was minimal. Impression:               - LA Grade A reflux esophagitis. Biopsied.                           - Esophageal mucosal changes suspicious for                            eosinophilic esophagitis. Biopsied.                           - The examination was otherwise  normal. Recommendation:           - Patient has a contact number available for                            emergencies. The signs and symptoms of potential                            delayed complications were discussed with the                            patient. Return to normal activities tomorrow.                            Written discharge instructions were provided to the                            patient.                           -  Resume previous diet.                           - Continue present medications.                           - Use Nexium (esomeprazole) 40 mg PO daily.                           - DC Pepcid Gatha Mayer, MD 04/13/2017 3:55:51 PM This report has been signed electronically.

## 2017-04-13 NOTE — Patient Instructions (Addendum)
The esophagus looks inflamed - not bad. I took biopsies to try to understand things. No signs of cancer or obvious pre-cancerous changes. I also saw some spasm - vs changes of something called eosinophilic esophagitis. I took biopsies of this also.  The colonoscopy was without polyps or cancer.  I am prescribing esomeprazole (Nexium) instead of the Pepcid. You need a stronger acid blocker. Hope no headache with this one.  I would not do an ultrasound at this point based upon what you were telling me and the findings here - looks like it was ordered but not done. We will cancel it.  I appreciate the opportunity to care for you. Gatha Mayer, MD, FACG  YOU HAD AN ENDOSCOPIC PROCEDURE TODAY AT [facility]:   Refer to the procedure report that was given to you for any specific questions about what was found during the examination.  If the procedure report does not answer your questions, please call your gastroenterologist to clarify.  If you requested that your care partner not be given the details of your procedure findings, then the procedure report has been included in a sealed envelope for you to review at your convenience later.  YOU SHOULD EXPECT: Some feelings of bloating in the abdomen. Passage of more gas than usual.  Walking can help get rid of the air that was put into your GI tract during the procedure and reduce the bloating. If you had a lower endoscopy (such as a colonoscopy or flexible sigmoidoscopy) you may notice spotting of blood in your stool or on the toilet paper. If you underwent a bowel prep for your procedure, you may not have a normal bowel movement for a few days.  Please Note:  You might notice some irritation and congestion in your nose or some drainage.  This is from the oxygen used during your procedure.  There is no need for concern and it should clear up in a day or so.  SYMPTOMS TO REPORT IMMEDIATELY:   Following lower endoscopy  (colonoscopy or flexible sigmoidoscopy):  Excessive amounts of blood in the stool  Significant tenderness or worsening of abdominal pains  Swelling of the abdomen that is new, acute  Fever of 100F or higher   Following upper endoscopy (EGD)  Vomiting of blood or coffee ground material  New chest pain or pain under the shoulder blades  Painful or persistently difficult swallowing  New shortness of breath  Fever of 100F or higher  Black, tarry-looking stools  For urgent or emergent issues, a gastroenterologist can be reached at any hour by calling [phone].   DIET:  We do recommend a small meal at first, but then you may proceed to your regular diet.  Drink plenty of fluids but you should avoid alcoholic beverages for 24 hours.  ACTIVITY:  You should plan to take it easy for the rest of today and you should NOT DRIVE or use heavy machinery until tomorrow (because of the sedation medicines used during the test).    FOLLOW UP: Our staff will call the number listed on your records the next business day following your procedure to check on you and address any questions or concerns that you may have regarding the information given to you following your procedure. If we do not reach you, we will leave a message.  However, if you are feeling well and you are not experiencing any problems, there is no need to return our call.  We will  assume that you have returned to your regular daily activities without incident.  If any biopsies were taken you will be contacted by phone or by letter within the next 1-3 weeks.  Please call us at [phone] if you have not heard about the biopsies in 3 weeks.    SIGNATURES/CONFIDENTIALITY: You and/or your care partner have signed paperwork which will be entered into your electronic medical record.  These signatures attest to the fact that that the information above on your After Visit Summary has been reviewed and is understood.  Full responsibility of  the confidentiality of this discharge information lies with you and/or your care-partner.  Esophagitis information given.  Recall colonoscopy 10 years  2028

## 2017-04-13 NOTE — Op Note (Signed)
[place] Patient Name: [name] Procedure Date: 04/13/2017 3:01 PM MRN: [id number] Endoscopist: Gatha Mayer , MD Age: 56 Referring MD:  Date of Birth: Apr 12, 1961 Gender: Female Account #: [id number] Procedure:                Colonoscopy Indications:              Screening for colorectal malignant neoplasm Medicines:                Propofol per Anesthesia, Monitored Anesthesia Care Procedure:                Pre-Anesthesia Assessment:                           - Prior to the procedure, a History and Physical                            was performed, and patient medications and                            allergies were reviewed. The patient's tolerance of                            previous anesthesia was also reviewed. The risks                            and benefits of the procedure and the sedation                            options and risks were discussed with the patient.                            All questions were answered, and informed consent                            was obtained. Prior Anticoagulants: The patient has                            taken no previous anticoagulant or antiplatelet                            agents. ASA Grade Assessment: II - A patient with                            mild systemic disease. After reviewing the risks                            and benefits, the patient was deemed in                            satisfactory condition to undergo the procedure.                           After obtaining informed consent, the colonoscope  was passed under direct vision. Throughout the                            procedure, the patient's blood pressure, pulse, and                            oxygen saturations were monitored continuously. The                            Colonoscope was introduced through the anus and                            advanced to the the cecum, identified by   appendiceal orifice and ileocecal valve. The                            quality of the bowel preparation was excellent. The                            colonoscopy was performed without difficulty. The                            patient tolerated the procedure well. The bowel                            preparation used was Miralax. The ileocecal valve,                            appendiceal orifice, and rectum were photographed. Scope In: 3:27:37 PM Scope Out: 3:36:09 PM Scope Withdrawal Time: 0 hours 6 minutes 1 second  Total Procedure Duration: 0 hours 8 minutes 32 seconds  Findings:                 The perianal and digital rectal examinations were                            normal.                           The colon (entire examined portion) appeared normal.                           No additional abnormalities were found on                            retroflexion.                           Anal papilla(e) were hypertrophied. Complications:            No immediate complications. Estimated blood loss:                            None. Estimated Blood Loss:     Estimated blood loss: none. Recommendation:           - Repeat colonoscopy/other appropriate test in 10  years for screening purposes.                           - Patient has a contact number available for                            emergencies. The signs and symptoms of potential                            delayed complications were discussed with the                            patient. Return to normal activities tomorrow.                            Written discharge instructions were provided to the                            patient.                           - Resume previous diet.                           - Continue present medications. Gatha Mayer, MD 04/13/2017 3:57:26 PM This report has been signed electronically.

## 2017-04-13 NOTE — Progress Notes (Signed)
Report to PACU, RN, vss, BBS= Clear.  

## 2017-04-13 NOTE — Progress Notes (Signed)
Called to room to assist during endoscopic procedure.  Patient ID and intended procedure confirmed with present staff. Received instructions for my participation in the procedure from the performing physician.  

## 2017-04-14 ENCOUNTER — Telehealth: Payer: Self-pay | Admitting: *Deleted

## 2017-04-14 NOTE — Telephone Encounter (Signed)
  Follow up Call-  Call back number 04/13/2017  Post procedure Call Back phone  # [phone]  Permission to leave phone message Yes  Some recent data might be hidden     Patient questions:  Do you have a fever, pain , or abdominal swelling? No. Pain Score  0 *  Have you tolerated food without any problems? Yes.    Have you been able to return to your normal activities? Yes.    Do you have any questions about your discharge instructions: Diet   No. Medications  No. Follow up visit  No.  Do you have questions or concerns about your Care? No.  Actions: * If pain score is 4 or above: No action needed, pain <4.

## 2017-04-20 ENCOUNTER — Encounter: Payer: Self-pay | Admitting: Internal Medicine

## 2017-04-20 NOTE — Progress Notes (Signed)
My chart letter re GERD on bx No recall

## 2017-04-25 ENCOUNTER — Encounter: Payer: 59 | Admitting: Internal Medicine

## 2017-05-12 ENCOUNTER — Encounter: Payer: 59 | Admitting: Internal Medicine

## 2017-06-08 DIAGNOSIS — J069 Acute upper respiratory infection, unspecified: Secondary | ICD-10-CM | POA: Diagnosis not present

## 2017-06-13 ENCOUNTER — Telehealth: Payer: Self-pay | Admitting: Internal Medicine

## 2017-06-13 NOTE — Telephone Encounter (Signed)
Please advise Sir? Thank you. 

## 2017-06-13 NOTE — Telephone Encounter (Signed)
I recommend she stay on PPI Tx chronically due to her GERD  If she is doing well we can reduce to 20 mg esopmeprazole # 90 3 refills  If that does not control her heartburn would need to go back to 40 mg  She should also follow GERD diet  She can schedule an OV if she wants to discuss further

## 2017-06-14 NOTE — Telephone Encounter (Signed)
Spoke with [name] and she said she just picked up her refill on her esopmeprazole 40mg .  She said she'll continue that and that she has a follow up appointment 07/06/17 with Dr Carlean Purl to discuss in more detail.  She will continue her GERD lifestyle changes.

## 2017-07-06 ENCOUNTER — Ambulatory Visit (INDEPENDENT_AMBULATORY_CARE_PROVIDER_SITE_OTHER): Payer: BLUE CROSS/BLUE SHIELD | Admitting: Internal Medicine

## 2017-07-06 ENCOUNTER — Encounter: Payer: Self-pay | Admitting: Internal Medicine

## 2017-07-06 VITALS — BP 100/64 | HR 76 | Ht 64.0 in | Wt 131.6 lb

## 2017-07-06 DIAGNOSIS — K2100 Gastro-esophageal reflux disease with esophagitis, without bleeding: Secondary | ICD-10-CM

## 2017-07-06 MED ORDER — ESOMEPRAZOLE MAGNESIUM 20 MG PO CPDR: 20.0000 mg | DELAYED_RELEASE_CAPSULE | Freq: Every day | ORAL | 11 refills | Status: DC

## 2017-07-06 NOTE — Progress Notes (Signed)
   [name] 56 y.o. 05/29/1961 [id number]  Assessment & Plan:   Encounter Diagnosis  Name Primary?  . Gastroesophageal reflux disease with esophagitis Yes    Though she has some concerns about potential side effects of PPI, she has a history of dysphagia, and erosive esophagitis.  Initially she was inclined to try to stop the medicine, and she could try that but we have agreed for her to reduce the dose to 20 mg of esomeprazole daily.  She will continue lifestyle changes.  I will see her back routinely in 1-2 years sooner if needed.  Subjective:   Chief Complaint: Follow-up of reflux  HPI The patient returns, she had been having chest pain and bad heartburn and I performed an upper endoscopy which showed grade a reflux esophagitis, there was some suspicion of possible eosinophilic esophagitis but the biopsies did not show that.  Since that endoscopy in October she has taken generic Nexium 40 mg daily faithfully and she feels very well except for rare occasional spasms that she describes it where liquid or food may transiently large.  That is not common and she feels significantly better.  Lately she has tried to take day PPI every other day to see if she could tolerate that and she has.  She is inclined not to take the medicine.  She would like not to having some concerns about side effects. Allergies  Allergen Reactions  . Citalopram Hydrobromide Other (See Comments)    Burning mouth  . Penicillins Hives    CHILDHOOD ALLERGY  . Adhesive [Tape] Rash    And blisters  . Latex Rash    Rash and blisters   Current Meds  Medication Sig  . ALPRAZolam (XANAX) 0.5 MG tablet Take 0.5 mg by mouth at bedtime as needed for anxiety.   . diphenhydramine-acetaminophen (TYLENOL PM) 25-500 MG TABS Take 1 tablet by mouth at bedtime as needed.  [name] esomeprazole (NEXIUM) 40 MG capsule Take 1 capsule (40 mg total) by mouth daily before breakfast.  . fluticasone (FLONASE) 50 MCG/ACT nasal spray  Place 2 sprays into both nostrils daily.  [name] ibuprofen (ADVIL,MOTRIN) 200 MG tablet Take 400 mg by mouth daily as needed for pain (takes about 3 times/week).   Past Medical History:  Diagnosis Date  . Anxiety   . Arm pain, left   . Barrett's esophagus   . Breast cancer ([place]) 45   hx  . Chest discomfort   . GERD (gastroesophageal reflux disease)   . Hemorrhoids, internal   . Hyperlipidemia   . IBS (irritable bowel syndrome)   . Palpitations   . Schatzki's ring    hx   Past Surgical History:  Procedure Laterality Date  . BREAST LUMPECTOMY Right 2007   with sentinel node dissection  . COLONOSCOPY  01/2004   internal hemorrhoids  . ESOPHAGOGASTRODUODENOSCOPY  01/2004, 02/2007   2006 Short Barrett's and 2008 not identified  . HYSTEROSCOPY DIAGNOSTIC  04/06/2011  . OVARIAN CYST REMOVAL    . PORTACATH PLACEMENT  2007   removed    Review of Systems As per HPI  Objective:   Physical Exam BP 100/64   Pulse 76   Ht 5\' 4"  (1.626 m)   Wt 131 lb 9.6 oz (59.7 kg)   LMP 03/15/2011   BMI 22.59 kg/m    15 minutes time spent with patient > half in counseling coordination of care

## 2017-07-06 NOTE — Patient Instructions (Signed)
  We have sent the following medications to your pharmacy for you to pick up at your convenience: Generic Nexium   I appreciate the opportunity to care for you. Silvano Rusk, MD, [facility]

## 2017-08-08 ENCOUNTER — Other Ambulatory Visit: Payer: Self-pay | Admitting: Obstetrics and Gynecology

## 2017-08-08 DIAGNOSIS — Z139 Encounter for screening, unspecified: Secondary | ICD-10-CM

## 2017-08-11 ENCOUNTER — Ambulatory Visit
Admission: RE | Admit: 2017-08-11 | Discharge: 2017-08-11 | Disposition: A | Discharge status: Home / Self Care | Payer: BLUE CROSS/BLUE SHIELD | Source: Ambulatory Visit | Attending: Obstetrics and Gynecology | Admitting: Obstetrics and Gynecology

## 2017-08-11 DIAGNOSIS — Z1231 Encounter for screening mammogram for malignant neoplasm of breast: Secondary | ICD-10-CM | POA: Diagnosis not present

## 2017-08-11 DIAGNOSIS — Z139 Encounter for screening, unspecified: Secondary | ICD-10-CM

## 2017-08-11 HISTORY — DX: Personal history of irradiation: Z92.3

## 2017-08-28 ENCOUNTER — Other Ambulatory Visit: Payer: Self-pay | Admitting: Internal Medicine

## 2017-08-29 ENCOUNTER — Telehealth: Payer: Self-pay | Admitting: Internal Medicine

## 2017-08-29 NOTE — Telephone Encounter (Signed)
Please advise Dr Carlean Purl, thank you.

## 2017-08-30 MED ORDER — ESOMEPRAZOLE MAGNESIUM 40 MG PO CPDR: 40.0000 mg | DELAYED_RELEASE_CAPSULE | Freq: Every day | ORAL | 3 refills | Status: DC

## 2017-08-30 NOTE — Telephone Encounter (Signed)
Nexium 40 mg sent in as requested. Her voice mail was full so I was unable to leave her a message that this was done.

## 2017-08-30 NOTE — Telephone Encounter (Signed)
Nexium generic 40 mg qd before breakfast # 90 3 RF

## 2017-08-31 ENCOUNTER — Telehealth: Payer: Self-pay

## 2017-08-31 NOTE — Telephone Encounter (Signed)
I did a prior authorization for patients esomeprazole 40 mg capsules thru [employer].  Dx GERD K21.9.  Patient has tried/failed Pepcid, lansoprazole 30 mg capsules, pantoprazole 40mg  , also the Nexium 20mg  capsules. Will await outcome.

## 2017-09-02 ENCOUNTER — Other Ambulatory Visit: Payer: Self-pay | Admitting: Internal Medicine

## 2017-09-02 DIAGNOSIS — K219 Gastro-esophageal reflux disease without esophagitis: Secondary | ICD-10-CM

## 2017-09-02 NOTE — Telephone Encounter (Signed)
I am not sure I can overcome that but am willing to try.  Please explain to patient and let's see if there is another level of appeal available?  Also ? If Dexilant is on her formulary

## 2017-09-02 NOTE — Telephone Encounter (Signed)
Reply from [facility] received today 09-02-2017. Rx for nexium 40 mg has been denied. . The reason for denial states the request does not meet the definition of medical necessity found in the members benefit handbook. Please advise.

## 2017-09-05 NOTE — Telephone Encounter (Signed)
I spoke with Willette Cluster at [facility] phone # [phone].  She told me how we could do a "Courtesy Review" to give them additional information to see if they will approve the Esopeprazole 40mg . The Ref # is [id number].  I faxed the extra information to Fax # [phone] explaining why she needs this rx and that she has tried 2 of there formulary medicines: pnatoprazole 40mg  and also omeprazole 40mg . We will await the outcome.

## 2017-09-05 NOTE — Telephone Encounter (Signed)
I tried to reach [name] and let her know an update however her mailbox is full and not taking new messages at this time.

## 2017-09-07 NOTE — Telephone Encounter (Signed)
I spoke to [place] at [employer] and she said the courtesy review will be done by March 4th.  I have informed the [place].

## 2017-09-12 NOTE — Telephone Encounter (Signed)
Patient's esomeprazole has been approved from 08/31/17-07/11/2038. I informed Kristopher Oppenheim and they said it would be $84.20 for 90 days worth. I called and informed [place] and she said thank you.

## 2018-01-18 ENCOUNTER — Ambulatory Visit: Payer: BLUE CROSS/BLUE SHIELD | Admitting: Neurology

## 2018-01-18 DIAGNOSIS — Z853 Personal history of malignant neoplasm of breast: Secondary | ICD-10-CM | POA: Diagnosis not present

## 2018-01-18 DIAGNOSIS — Z800 Family history of malignant neoplasm of digestive organs: Secondary | ICD-10-CM | POA: Diagnosis not present

## 2018-01-18 DIAGNOSIS — Z803 Family history of malignant neoplasm of breast: Secondary | ICD-10-CM | POA: Diagnosis not present

## 2018-01-18 DIAGNOSIS — Z801 Family history of malignant neoplasm of trachea, bronchus and lung: Secondary | ICD-10-CM | POA: Diagnosis not present

## 2018-01-18 DIAGNOSIS — N950 Postmenopausal bleeding: Secondary | ICD-10-CM | POA: Diagnosis not present

## 2018-01-18 DIAGNOSIS — Z01419 Encounter for gynecological examination (general) (routine) without abnormal findings: Secondary | ICD-10-CM | POA: Diagnosis not present

## 2018-01-18 DIAGNOSIS — Z6821 Body mass index (BMI) 21.0-21.9, adult: Secondary | ICD-10-CM | POA: Diagnosis not present

## 2018-02-08 ENCOUNTER — Telehealth: Payer: Self-pay | Admitting: Internal Medicine

## 2018-02-08 NOTE — Telephone Encounter (Signed)
Patient having reflux.  She stopped her nexium for a few months, but when the reflux returned she resumed it.  She has missed a few days here and there.  She is encouraged to take her nexium BID for 2 weeks while being on a strict antireflux diet.  Patient instructed to maintain an anti-reflux diet. Advised to avoid caffeine, mint, citrus foods/juices, tomatoes,  chocolate, NSAIDS/ASA products.  Instructed not to eat within 2 hours of exercise or bed, multiple small meals are better than 3 large meals.  Need to take PPI 30 minutes prior to 1st meal of the day. She will take the second dose of nexium at [facility] for 2 weeks then return to [facility]D.  She can use OTC heartburn products as needed.  She will call back if this fails to control her symptoms.

## 2018-02-27 ENCOUNTER — Telehealth: Payer: Self-pay | Admitting: Internal Medicine

## 2018-02-27 DIAGNOSIS — R1310 Dysphagia, unspecified: Secondary | ICD-10-CM

## 2018-02-27 MED ORDER — HYOSCYAMINE SULFATE 0.125 MG SL SUBL: 0.1250 mg | SUBLINGUAL_TABLET | SUBLINGUAL | 1 refills | Status: DC | PRN

## 2018-02-27 NOTE — Telephone Encounter (Signed)
:  00 and be NPO for 3 hours priorPatient notified of the recommendations and new rx.  No new medications or any of the listed rx.  She is notified to arrive at [facility] radiology on 03/01/18 at 8:45 and be NPO for 4 hours.  Patient verbalized understanding.

## 2018-02-27 NOTE — Telephone Encounter (Signed)
Left message for patient to call back  

## 2018-02-27 NOTE — Telephone Encounter (Signed)
Sorry to hear  1) Levsin SL 0.125 mg 1-2 prn q 4 hrs # 90 1 Refill  Would take before meals to try to prevent sxs  2) Ba swallow re: odynophagia/dysphagia - may help [place] find the spasm problem  3) I reviewed med list but make sure no recent doxycycline, tetracycline, prednisone or other new meds

## 2018-02-27 NOTE — Telephone Encounter (Signed)
Patient reports terrible "excruciating pain for 3-5 seconds as soon as a swallow".  She reports symptoms present for the last 3-4 weeks.  Waxes and wain for the last few weeks but now consistent for the last week.  Pain is in her back and in her shoulders when she swallows. She is maintained on Nexium 40 daily.  She is also taking pepcid complete and TUMS.  The TUMS helps for an hour. Belching is extremely painful as well. "a really deep breath hurts as well".  pains worse in the left shoulder and 'feels like it radiates from my back".  She feels like this is a spasm.  No APP appts until 9/4.  Please advise

## 2018-02-27 NOTE — Telephone Encounter (Signed)
Patient states she is still having a lot of pain when eating or drinking anything and wants to know if she should come back in or increase medications. Pt seeking advice.

## 2018-03-01 ENCOUNTER — Telehealth: Payer: Self-pay | Admitting: Internal Medicine

## 2018-03-01 ENCOUNTER — Ambulatory Visit (INDEPENDENT_AMBULATORY_CARE_PROVIDER_SITE_OTHER)
Admission: RE | Admit: 2018-03-01 | Discharge: 2018-03-01 | Disposition: A | Discharge status: Home / Self Care | Payer: BLUE CROSS/BLUE SHIELD | Source: Ambulatory Visit | Attending: Internal Medicine | Admitting: Internal Medicine

## 2018-03-01 ENCOUNTER — Other Ambulatory Visit: Payer: Self-pay | Admitting: Internal Medicine

## 2018-03-01 ENCOUNTER — Ambulatory Visit (HOSPITAL_COMMUNITY)
Admission: RE | Admit: 2018-03-01 | Discharge: 2018-03-01 | Disposition: A | Discharge status: Home / Self Care | Payer: BLUE CROSS/BLUE SHIELD | Source: Ambulatory Visit | Attending: Internal Medicine | Admitting: Internal Medicine

## 2018-03-01 DIAGNOSIS — R0781 Pleurodynia: Secondary | ICD-10-CM

## 2018-03-01 DIAGNOSIS — R1310 Dysphagia, unspecified: Secondary | ICD-10-CM | POA: Diagnosis not present

## 2018-03-01 DIAGNOSIS — K209 Esophagitis, unspecified: Secondary | ICD-10-CM | POA: Diagnosis not present

## 2018-03-01 DIAGNOSIS — R1013 Epigastric pain: Secondary | ICD-10-CM

## 2018-03-01 DIAGNOSIS — R079 Chest pain, unspecified: Secondary | ICD-10-CM

## 2018-03-01 NOTE — Progress Notes (Signed)
Message left re: NL study and that I wanted to discuss her sxs Will call back when able

## 2018-03-01 NOTE — Telephone Encounter (Signed)
Gatha Mayer, MD sent to Marlon Pel, RN        I spoke to her and reviewed findings  Sx recap - episodic short spells of severe chest + epigastric pains that can radiate into left shldr (x mos) occurs worse w/ drinking liquids but w/o also  Can be hard to get a deep breath due to pain   Xanax can help but only uses prn at hs   Plan  1) Stop Nexium - ? Side effect  2) Pepcid prn  3) Schedule complete abd [place] - epigastric pain, chest pain  4) Also have her do PA lat CXR re: pleuritic chest pain (I did not tell her this)  5) She will update me by My Chart Friday    Patient notified of the recommendations She is scheduled for [place] at [facility] 03/03/18 9:00.  She is aware to be NPO after midnight She will come to [place] office tomorrow on her lunch for CXR

## 2018-03-01 NOTE — Progress Notes (Signed)
I spoke to her and reviewed findings Sx recap - episodic short spells of severe chest + epigastric pains that can radiate into left shldr (x mos) occurs worse w/ drinking liquids but w/o also Can be hard to get a deep breath due to pain  Xanax can help but only uses prn at hs  Plan 1) Stop Nexium - ? Side effect 2) Pepcid prn 3) Schedule complete abd [place] - epigastric pain, chest pain 4) Also have her do PA lat CXR re: pleuritic chest pain (I did not tell her this) 5) She will update me by My Chart Friday

## 2018-03-02 NOTE — Progress Notes (Signed)
CXR ok My Chart

## 2018-03-03 ENCOUNTER — Ambulatory Visit (HOSPITAL_COMMUNITY)
Admission: RE | Admit: 2018-03-03 | Discharge: 2018-03-03 | Disposition: A | Discharge status: Home / Self Care | Payer: BLUE CROSS/BLUE SHIELD | Source: Ambulatory Visit | Attending: Internal Medicine | Admitting: Internal Medicine

## 2018-03-03 DIAGNOSIS — R079 Chest pain, unspecified: Secondary | ICD-10-CM | POA: Diagnosis not present

## 2018-03-03 DIAGNOSIS — R1013 Epigastric pain: Secondary | ICD-10-CM | POA: Insufficient documentation

## 2018-03-08 ENCOUNTER — Other Ambulatory Visit: Payer: Self-pay | Admitting: Dermatology

## 2018-03-08 DIAGNOSIS — D485 Neoplasm of uncertain behavior of skin: Secondary | ICD-10-CM | POA: Diagnosis not present

## 2018-03-21 ENCOUNTER — Telehealth: Payer: Self-pay | Admitting: Internal Medicine

## 2018-03-21 NOTE — Telephone Encounter (Signed)
Patient reports that she still has pain when she takes a deep breath "like my kidneys are being grabbed".  She stopped all medications as you ordered.  [place], BS, CXR are all negative.  Please advise (see last results from 03/03/18)

## 2018-03-22 NOTE — Telephone Encounter (Signed)
Patient notified of recommendations She will call back for GI concerns.

## 2018-03-22 NOTE — Telephone Encounter (Signed)
I am not sure this is coming from her GI tract  She may need to see pulmonary but I suggest she try to get in with her PCP Dr. Alyson Ingles or one of his colleagues  If it is affecting her singing severely and she needs quick eval she may need to go to the [facility] walk in clinic but perhaps they could get her into PCP office this week.  Please make sure all recent results and notes go to Dr. Alyson Ingles though they should be able to look at Epic also.  Tell her to let me/us know if this is not helpful

## 2018-05-18 DIAGNOSIS — J0100 Acute maxillary sinusitis, unspecified: Secondary | ICD-10-CM | POA: Diagnosis not present

## 2018-06-03 DIAGNOSIS — M79671 Pain in right foot: Secondary | ICD-10-CM | POA: Diagnosis not present

## 2018-06-05 DIAGNOSIS — S92354A Nondisplaced fracture of fifth metatarsal bone, right foot, initial encounter for closed fracture: Secondary | ICD-10-CM | POA: Diagnosis not present

## 2018-07-03 DIAGNOSIS — S92354D Nondisplaced fracture of fifth metatarsal bone, right foot, subsequent encounter for fracture with routine healing: Secondary | ICD-10-CM | POA: Diagnosis not present

## 2018-07-19 ENCOUNTER — Other Ambulatory Visit: Payer: Self-pay | Admitting: Obstetrics and Gynecology

## 2018-07-19 DIAGNOSIS — Z1231 Encounter for screening mammogram for malignant neoplasm of breast: Secondary | ICD-10-CM

## 2018-07-31 DIAGNOSIS — S92354D Nondisplaced fracture of fifth metatarsal bone, right foot, subsequent encounter for fracture with routine healing: Secondary | ICD-10-CM | POA: Diagnosis not present

## 2018-08-23 IMAGING — DX DG CHEST 2V
2 series · 2 of 2 positions shown · non-contrast
Comparison: Chest x-ray dated May 18, 2006.

CLINICAL DATA: Shortness of breath.

EXAM:
CHEST  2 VIEW

[chest pa]
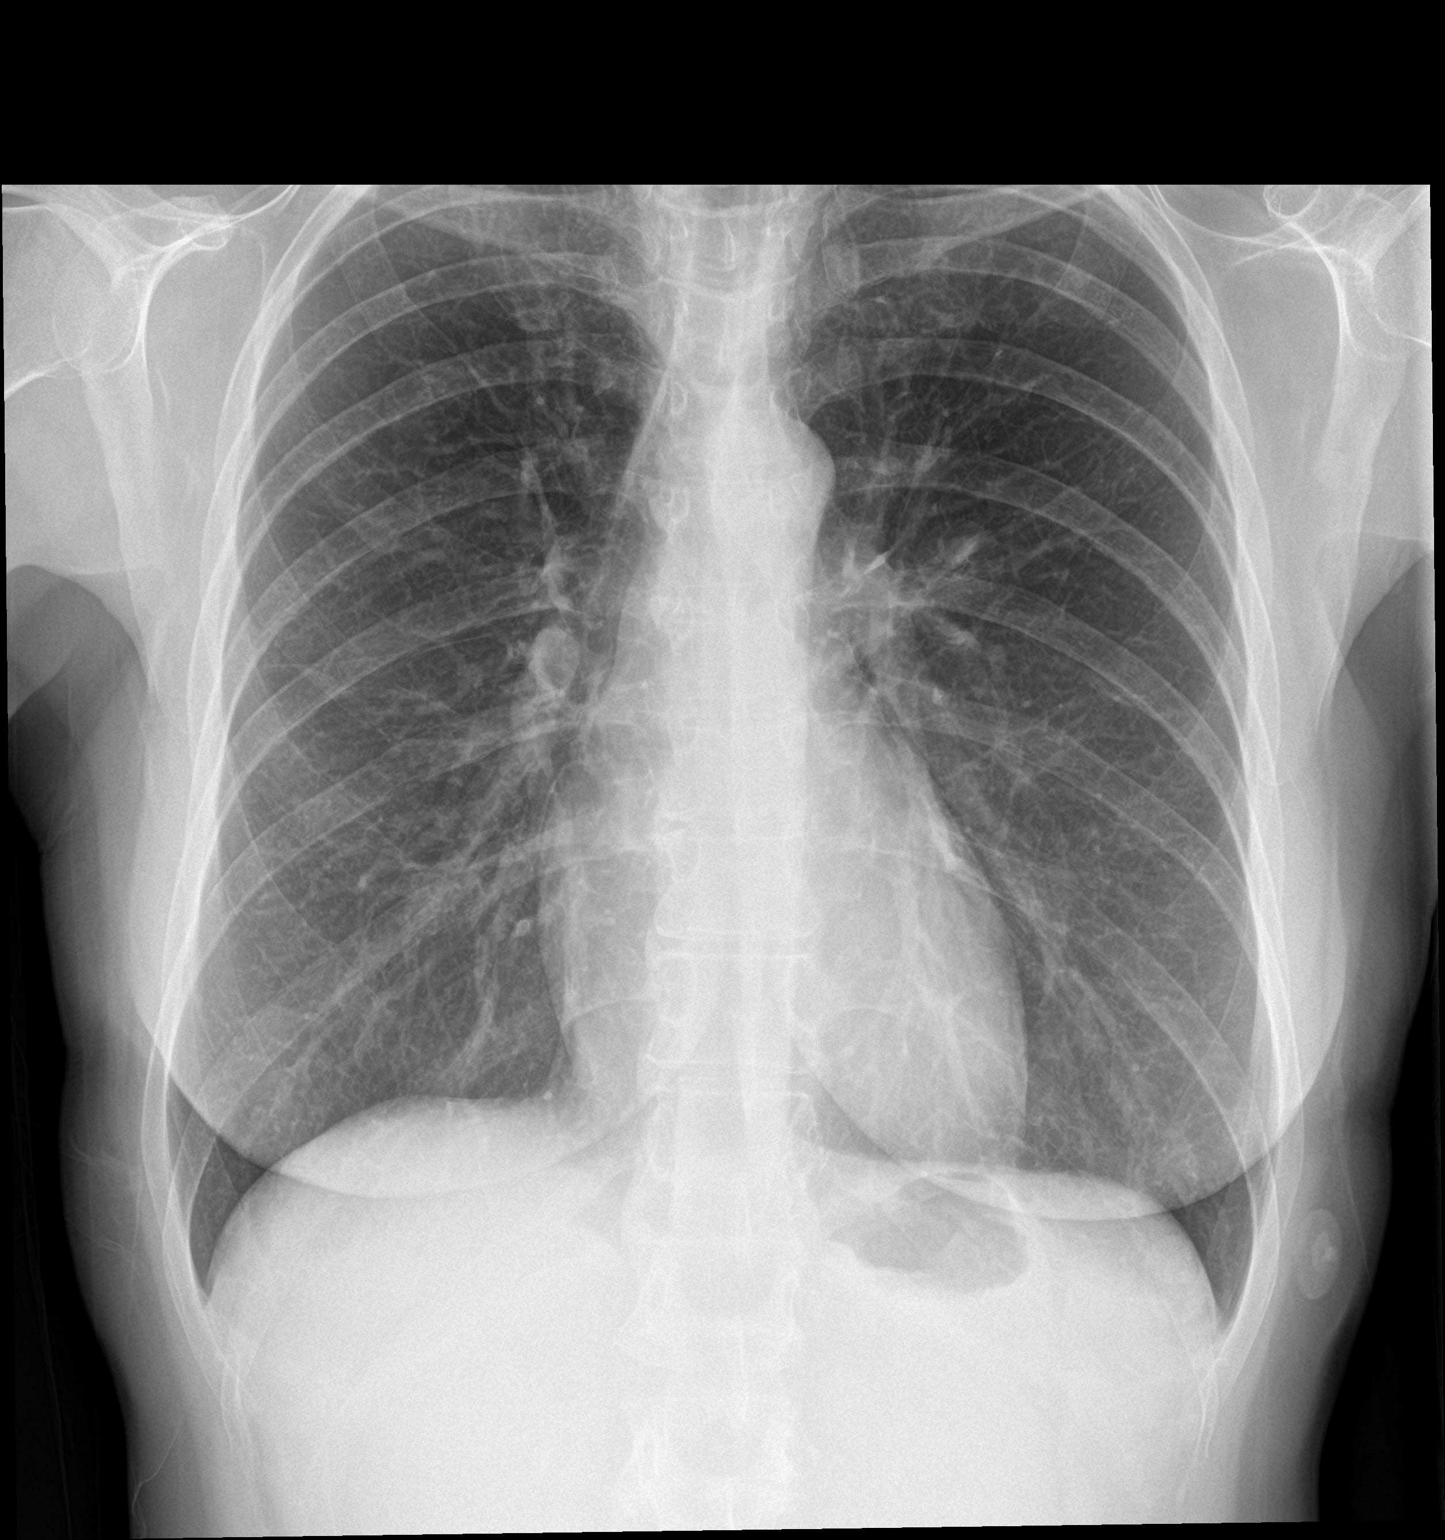

[chest lat]
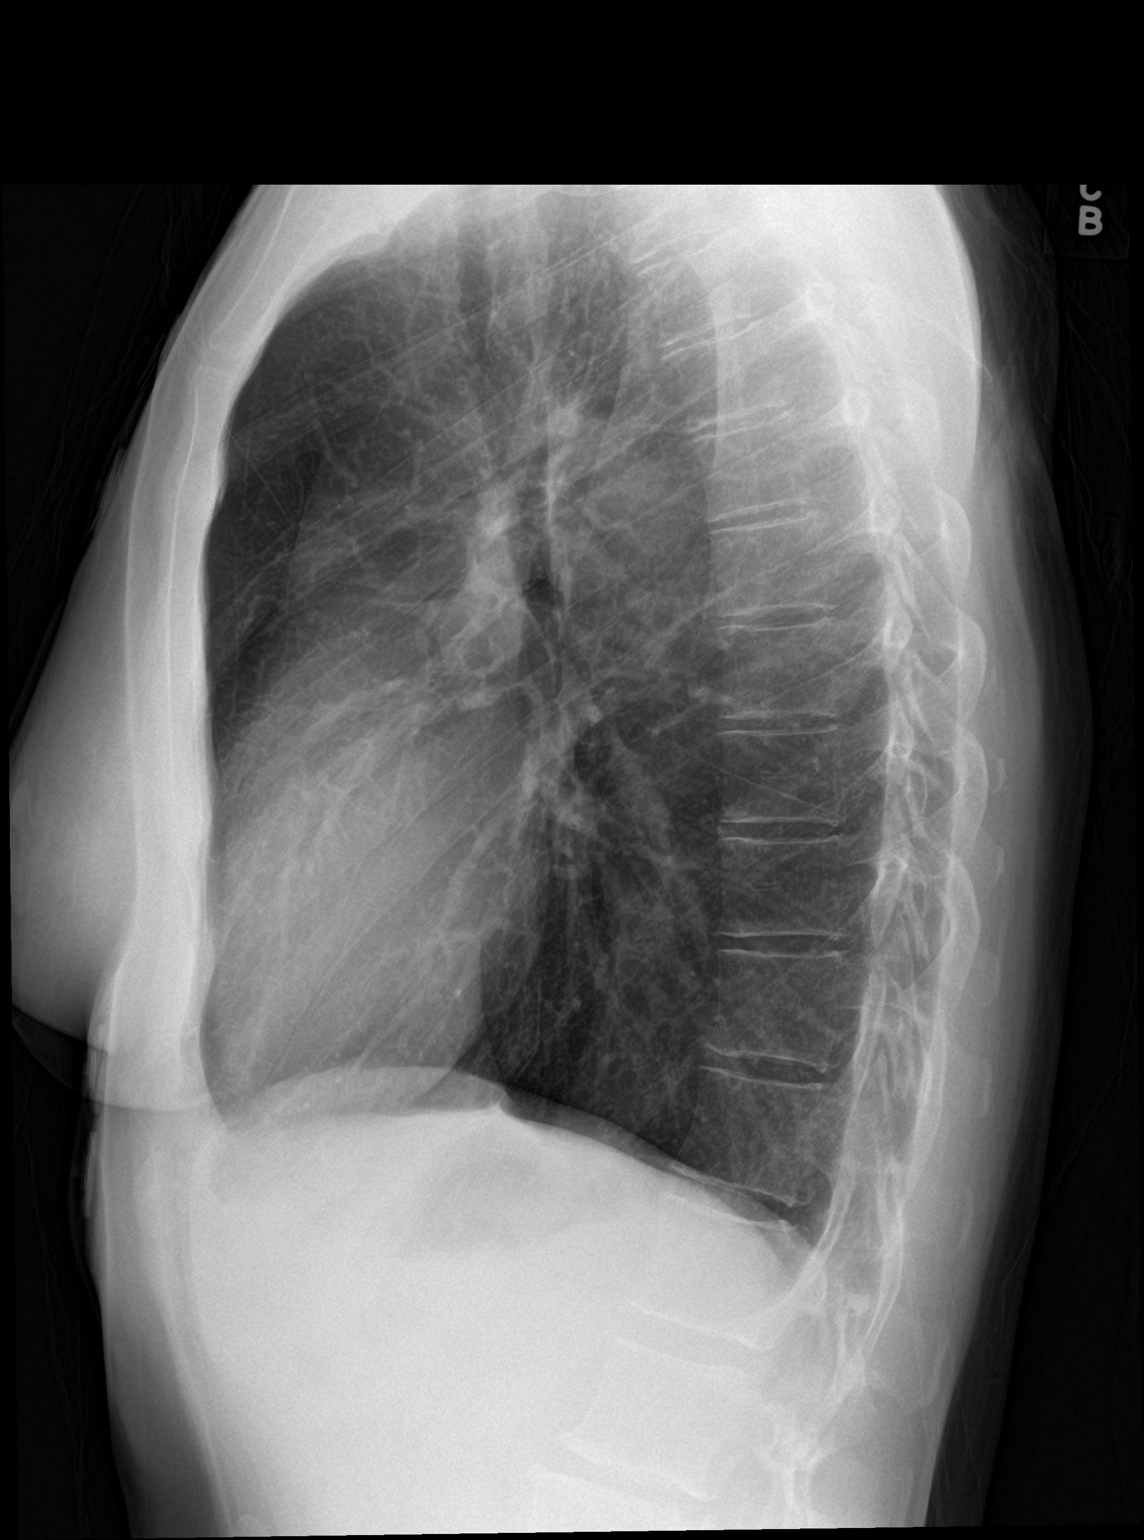

[2 of 2 positions shown; findings below may reference images not displayed]

FINDINGS: Interval removal of left-sided port catheter. The cardiomediastinal
silhouette is normal in size. Normal pulmonary vascularity. No focal
consolidation, pleural effusion, or pneumothorax. No acute osseous
abnormality.
IMPRESSION: No active cardiopulmonary disease.

## 2018-08-30 ENCOUNTER — Ambulatory Visit
Admission: RE | Admit: 2018-08-30 | Discharge: 2018-08-30 | Disposition: A | Discharge status: Home / Self Care | Payer: BLUE CROSS/BLUE SHIELD | Source: Ambulatory Visit | Attending: Obstetrics and Gynecology | Admitting: Obstetrics and Gynecology

## 2018-08-30 DIAGNOSIS — Z1231 Encounter for screening mammogram for malignant neoplasm of breast: Secondary | ICD-10-CM | POA: Diagnosis not present

## 2018-09-05 DIAGNOSIS — S92354D Nondisplaced fracture of fifth metatarsal bone, right foot, subsequent encounter for fracture with routine healing: Secondary | ICD-10-CM | POA: Diagnosis not present

## 2018-09-13 DIAGNOSIS — J069 Acute upper respiratory infection, unspecified: Secondary | ICD-10-CM | POA: Diagnosis not present

## 2018-09-29 DIAGNOSIS — H10413 Chronic giant papillary conjunctivitis, bilateral: Secondary | ICD-10-CM | POA: Diagnosis not present

## 2018-12-28 DIAGNOSIS — Z20828 Contact with and (suspected) exposure to other viral communicable diseases: Secondary | ICD-10-CM | POA: Diagnosis not present

## 2019-01-24 DIAGNOSIS — Z6822 Body mass index (BMI) 22.0-22.9, adult: Secondary | ICD-10-CM | POA: Diagnosis not present

## 2019-01-24 DIAGNOSIS — Z13220 Encounter for screening for lipoid disorders: Secondary | ICD-10-CM | POA: Diagnosis not present

## 2019-01-24 DIAGNOSIS — Z1321 Encounter for screening for nutritional disorder: Secondary | ICD-10-CM | POA: Diagnosis not present

## 2019-01-24 DIAGNOSIS — Z01419 Encounter for gynecological examination (general) (routine) without abnormal findings: Secondary | ICD-10-CM | POA: Diagnosis not present

## 2019-01-24 DIAGNOSIS — Z1329 Encounter for screening for other suspected endocrine disorder: Secondary | ICD-10-CM | POA: Diagnosis not present

## 2019-01-24 DIAGNOSIS — Z13228 Encounter for screening for other metabolic disorders: Secondary | ICD-10-CM | POA: Diagnosis not present

## 2019-03-21 DIAGNOSIS — L249 Irritant contact dermatitis, unspecified cause: Secondary | ICD-10-CM | POA: Diagnosis not present

## 2019-06-04 ENCOUNTER — Other Ambulatory Visit: Payer: Self-pay

## 2019-06-04 DIAGNOSIS — Z20822 Contact with and (suspected) exposure to covid-19: Secondary | ICD-10-CM

## 2019-06-05 LAB — NOVEL CORONAVIRUS, NAA: SARS-CoV-2, NAA: NOT DETECTED

## 2019-07-19 DIAGNOSIS — Z1159 Encounter for screening for other viral diseases: Secondary | ICD-10-CM | POA: Diagnosis not present

## 2019-09-07 IMAGING — DX DG CHEST 2V
2 series · 2 of 2 positions shown · non-contrast
Comparison: February 14, 2017

CLINICAL DATA: Pleuritic chest pain

EXAM:
CHEST - 2 VIEW

[chest pa]
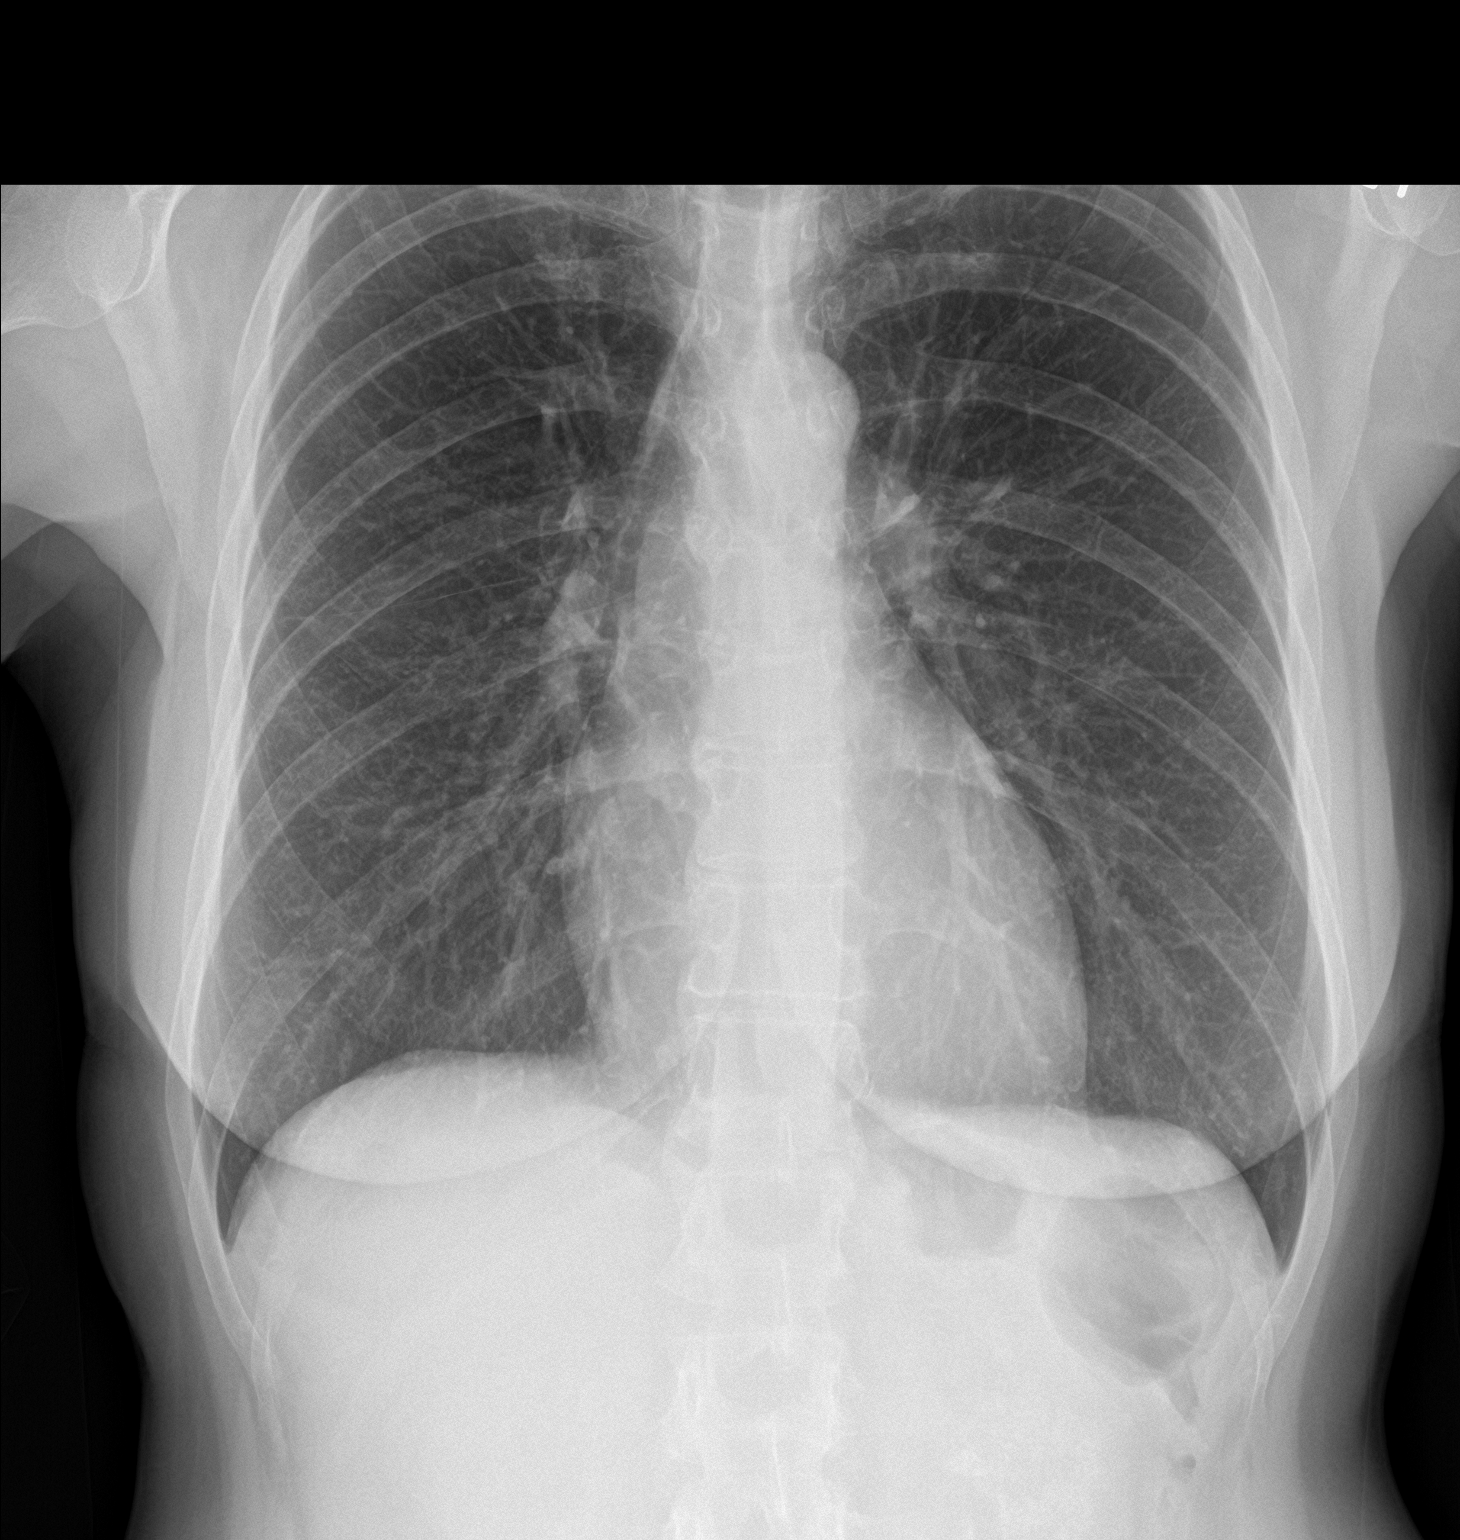

[chest lat]
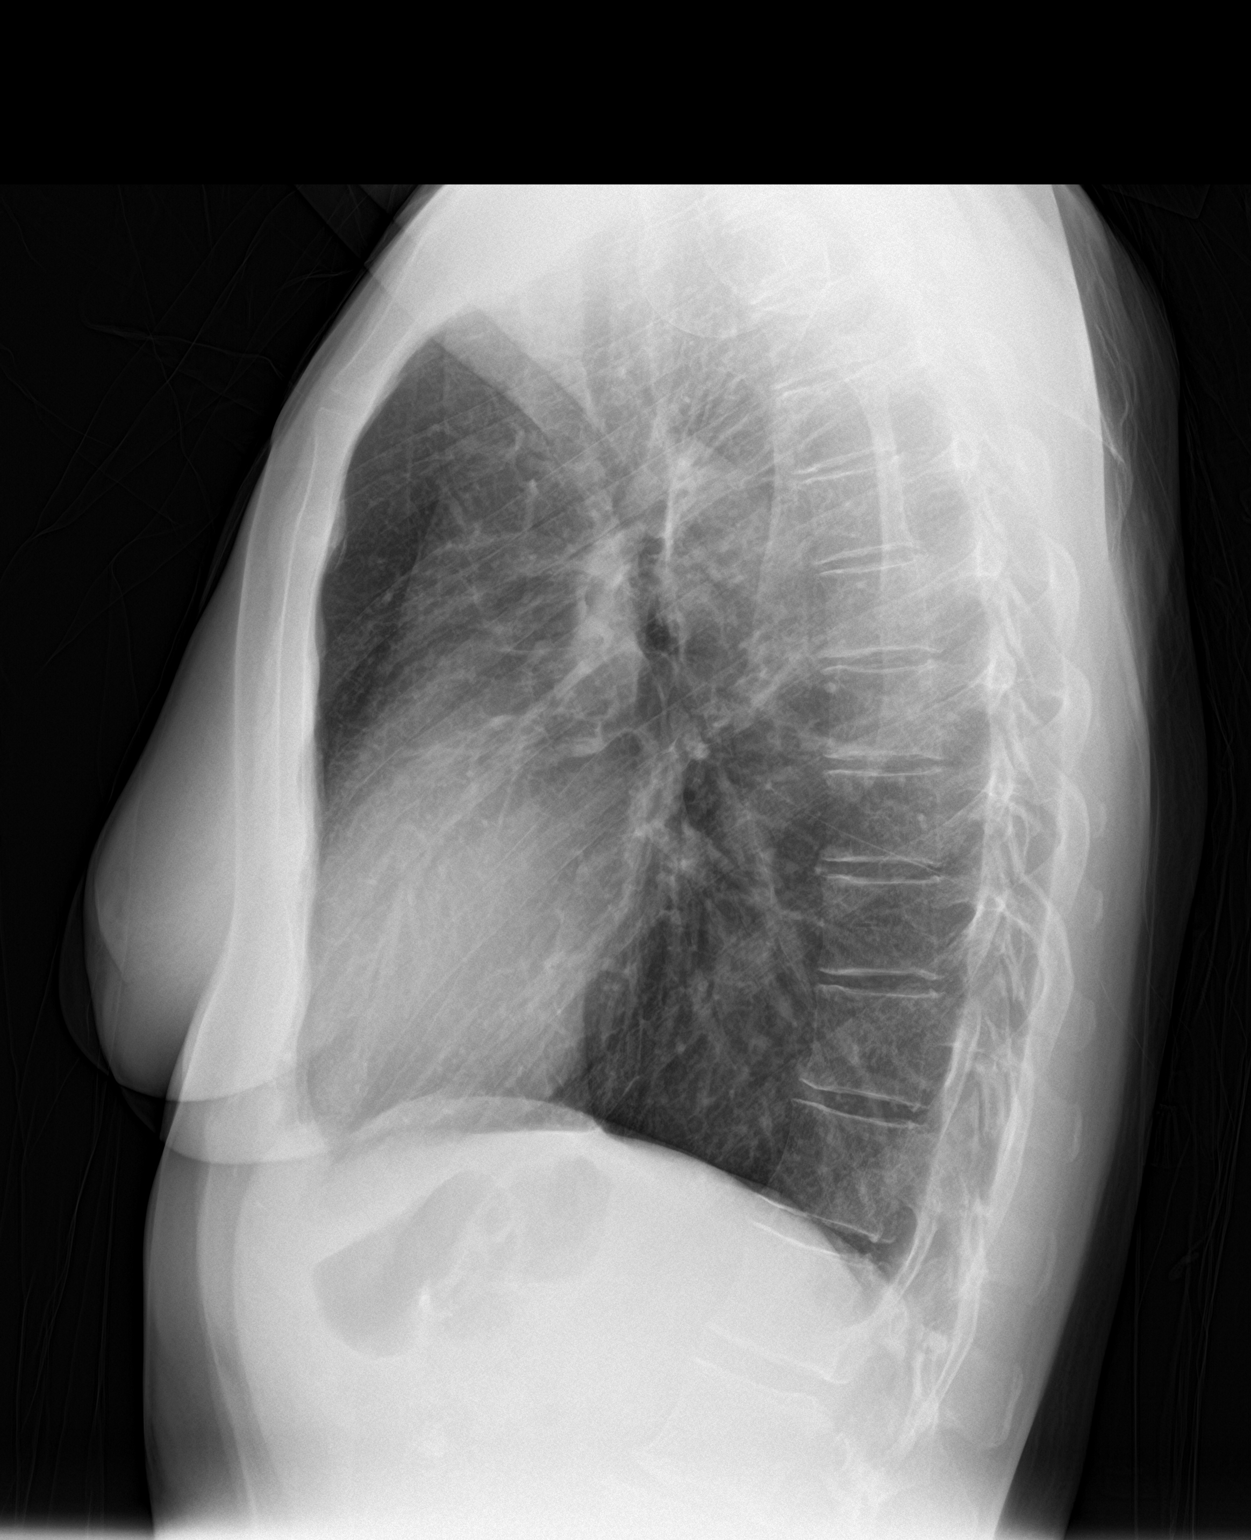

[2 of 2 positions shown; findings below may reference images not displayed]

FINDINGS: There is no edema or consolidation. The heart size and pulmonary
vascularity are normal. No adenopathy. No pneumothorax. No bone
lesions.
IMPRESSION: No edema or consolidation.

## 2019-09-24 DIAGNOSIS — J302 Other seasonal allergic rhinitis: Secondary | ICD-10-CM | POA: Diagnosis not present

## 2019-09-24 DIAGNOSIS — R509 Fever, unspecified: Secondary | ICD-10-CM | POA: Diagnosis not present

## 2019-09-24 DIAGNOSIS — J069 Acute upper respiratory infection, unspecified: Secondary | ICD-10-CM | POA: Diagnosis not present

## 2019-12-13 DIAGNOSIS — H43812 Vitreous degeneration, left eye: Secondary | ICD-10-CM | POA: Diagnosis not present

## 2019-12-22 ENCOUNTER — Ambulatory Visit: Payer: BLUE CROSS/BLUE SHIELD

## 2020-03-03 DIAGNOSIS — L237 Allergic contact dermatitis due to plants, except food: Secondary | ICD-10-CM | POA: Diagnosis not present

## 2020-03-26 DIAGNOSIS — Z6822 Body mass index (BMI) 22.0-22.9, adult: Secondary | ICD-10-CM | POA: Diagnosis not present

## 2020-03-26 DIAGNOSIS — Z01419 Encounter for gynecological examination (general) (routine) without abnormal findings: Secondary | ICD-10-CM | POA: Diagnosis not present

## 2020-03-27 ENCOUNTER — Other Ambulatory Visit: Payer: Self-pay | Admitting: Obstetrics and Gynecology

## 2020-03-27 DIAGNOSIS — Z1231 Encounter for screening mammogram for malignant neoplasm of breast: Secondary | ICD-10-CM

## 2020-03-27 DIAGNOSIS — Z01419 Encounter for gynecological examination (general) (routine) without abnormal findings: Secondary | ICD-10-CM | POA: Diagnosis not present

## 2020-04-25 ENCOUNTER — Other Ambulatory Visit: Payer: Self-pay

## 2020-04-25 ENCOUNTER — Ambulatory Visit
Admission: RE | Admit: 2020-04-25 | Discharge: 2020-04-25 | Disposition: A | Discharge status: Home / Self Care | Payer: BLUE CROSS/BLUE SHIELD | Source: Ambulatory Visit | Attending: Obstetrics and Gynecology | Admitting: Obstetrics and Gynecology

## 2020-04-25 DIAGNOSIS — Z1231 Encounter for screening mammogram for malignant neoplasm of breast: Secondary | ICD-10-CM | POA: Diagnosis not present

## 2020-05-29 DIAGNOSIS — J0100 Acute maxillary sinusitis, unspecified: Secondary | ICD-10-CM | POA: Diagnosis not present

## 2020-09-03 DIAGNOSIS — F419 Anxiety disorder, unspecified: Secondary | ICD-10-CM | POA: Diagnosis not present

## 2020-09-03 DIAGNOSIS — R21 Rash and other nonspecific skin eruption: Secondary | ICD-10-CM | POA: Diagnosis not present

## 2020-10-21 ENCOUNTER — Ambulatory Visit: Payer: BC Managed Care – PPO | Admitting: Dermatology

## 2020-10-30 DIAGNOSIS — J069 Acute upper respiratory infection, unspecified: Secondary | ICD-10-CM | POA: Diagnosis not present

## 2020-12-04 DIAGNOSIS — R8290 Unspecified abnormal findings in urine: Secondary | ICD-10-CM | POA: Diagnosis not present

## 2020-12-04 DIAGNOSIS — M545 Low back pain, unspecified: Secondary | ICD-10-CM | POA: Diagnosis not present

## 2020-12-04 DIAGNOSIS — R1031 Right lower quadrant pain: Secondary | ICD-10-CM | POA: Diagnosis not present

## 2021-06-09 DIAGNOSIS — L309 Dermatitis, unspecified: Secondary | ICD-10-CM | POA: Diagnosis not present

## 2021-06-14 DIAGNOSIS — L509 Urticaria, unspecified: Secondary | ICD-10-CM | POA: Diagnosis not present

## 2021-06-14 DIAGNOSIS — F419 Anxiety disorder, unspecified: Secondary | ICD-10-CM | POA: Diagnosis not present

## 2021-06-14 DIAGNOSIS — L309 Dermatitis, unspecified: Secondary | ICD-10-CM | POA: Diagnosis not present

## 2021-07-15 DIAGNOSIS — R21 Rash and other nonspecific skin eruption: Secondary | ICD-10-CM | POA: Diagnosis not present

## 2021-07-20 ENCOUNTER — Other Ambulatory Visit: Payer: Self-pay

## 2021-07-20 ENCOUNTER — Ambulatory Visit (INDEPENDENT_AMBULATORY_CARE_PROVIDER_SITE_OTHER): Payer: BC Managed Care – PPO | Admitting: Dermatology

## 2021-07-20 DIAGNOSIS — R21 Rash and other nonspecific skin eruption: Secondary | ICD-10-CM

## 2021-07-20 DIAGNOSIS — Z5181 Encounter for therapeutic drug level monitoring: Secondary | ICD-10-CM | POA: Diagnosis not present

## 2021-07-20 MED ORDER — DAPSONE 25 MG PO TABS: 25.0000 mg | ORAL_TABLET | Freq: Every day | ORAL | 0 refills | Status: AC

## 2021-07-20 NOTE — Patient Instructions (Addendum)
PLEASE PURCHASE OVER THE COUNTER PRAMOXINE (ANTI ITCH)  Call us after blood work is complete then we will ok the new start on dapsone after g6pd and other labs are complete.

## 2021-07-21 DIAGNOSIS — R21 Rash and other nonspecific skin eruption: Secondary | ICD-10-CM | POA: Diagnosis not present

## 2021-07-21 DIAGNOSIS — Z5181 Encounter for therapeutic drug level monitoring: Secondary | ICD-10-CM | POA: Diagnosis not present

## 2021-07-26 LAB — CBC WITH DIFFERENTIAL/PLATELET
Absolute Monocytes: 614 cells/uL (ref 200–950)
Basophils Absolute: 68 cells/uL (ref 0–200)
Basophils Relative: 1.3 %
Eosinophils Absolute: 442 cells/uL (ref 15–500)
Eosinophils Relative: 8.5 %
HCT: 41.8 % (ref 35.0–45.0)
Hemoglobin: 13.3 g/dL (ref 11.7–15.5)
Lymphs Abs: 1674 cells/uL (ref 850–3900)
MCH: 27.4 pg (ref 27.0–33.0)
MCHC: 31.8 g/dL — ABNORMAL LOW (ref 32.0–36.0)
MCV: 86.2 fL (ref 80.0–100.0)
MPV: 10.3 fL (ref 7.5–12.5)
Monocytes Relative: 11.8 %
Neutro Abs: 2402 cells/uL (ref 1500–7800)
Neutrophils Relative %: 46.2 %
Platelets: 294 10*3/uL (ref 140–400)
RBC: 4.85 10*6/uL (ref 3.80–5.10)
RDW: 12.8 % (ref 11.0–15.0)
Total Lymphocyte: 32.2 %
WBC: 5.2 10*3/uL (ref 3.8–10.8)

## 2021-07-26 LAB — COMPREHENSIVE METABOLIC PANEL
AG Ratio: 1.7 (calc) (ref 1.0–2.5)
ALT: 32 U/L — ABNORMAL HIGH (ref 6–29)
AST: 44 U/L — ABNORMAL HIGH (ref 10–35)
Albumin: 3.8 g/dL (ref 3.6–5.1)
Alkaline phosphatase (APISO): 61 U/L (ref 37–153)
BUN: 10 mg/dL (ref 7–25)
CO2: 31 mmol/L (ref 20–32)
Calcium: 8.8 mg/dL (ref 8.6–10.4)
Chloride: 102 mmol/L (ref 98–110)
Creat: 0.78 mg/dL (ref 0.50–1.03)
Globulin: 2.3 g/dL (calc) (ref 1.9–3.7)
Glucose, Bld: 91 mg/dL (ref 65–139)
Potassium: 3.8 mmol/L (ref 3.5–5.3)
Sodium: 138 mmol/L (ref 135–146)
Total Bilirubin: 1 mg/dL (ref 0.2–1.2)
Total Protein: 6.1 g/dL (ref 6.1–8.1)

## 2021-07-26 LAB — SEDIMENTATION RATE: Sed Rate: 2 mm/h (ref 0–30)

## 2021-07-26 LAB — GLUCOSE 6 PHOSPHATE DEHYDROGENASE: G-6PDH: 17.2 U/g Hgb (ref 7.0–20.5)

## 2021-07-27 ENCOUNTER — Telehealth: Payer: Self-pay | Admitting: *Deleted

## 2021-07-27 DIAGNOSIS — R21 Rash and other nonspecific skin eruption: Secondary | ICD-10-CM

## 2021-07-27 NOTE — Telephone Encounter (Signed)
-----   Message from Lavonna Monarch, MD sent at 07/26/2021  8:46 PM EST ----- Unless rash is improved, she is okay to begin dapsone 25 mg daily.  Repeat CBC in 1 to 2 weeks.

## 2021-07-27 NOTE — Telephone Encounter (Signed)
Labs to patient. She will start dapsone 25mg  daily. Order sent in for CBC. Patient is to get her labs done in 1-2 weeks.

## 2021-07-27 NOTE — Telephone Encounter (Signed)
Left patient message to call back with results.

## 2021-08-11 ENCOUNTER — Ambulatory Visit: Payer: BC Managed Care – PPO | Admitting: Dermatology

## 2021-08-11 ENCOUNTER — Ambulatory Visit (INDEPENDENT_AMBULATORY_CARE_PROVIDER_SITE_OTHER): Payer: BC Managed Care – PPO | Admitting: Dermatology

## 2021-08-11 ENCOUNTER — Encounter: Payer: Self-pay | Admitting: Dermatology

## 2021-08-11 ENCOUNTER — Other Ambulatory Visit: Payer: Self-pay

## 2021-08-11 DIAGNOSIS — Z5181 Encounter for therapeutic drug level monitoring: Secondary | ICD-10-CM

## 2021-08-11 DIAGNOSIS — R21 Rash and other nonspecific skin eruption: Secondary | ICD-10-CM

## 2021-08-11 MED ORDER — CLOBETASOL PROP EMOLLIENT BASE 0.05 % EX CREA: TOPICAL_CREAM | CUTANEOUS | 3 refills | Status: DC

## 2021-08-11 MED ORDER — DOXEPIN HCL 10 MG PO CAPS: 10.0000 mg | ORAL_CAPSULE | Freq: Every day | ORAL | 2 refills | Status: DC

## 2021-08-11 NOTE — Progress Notes (Signed)
° °  New Patient   Subjective  [name] is a 61 y.o. female who presents for the following: Rash (Here for rash that comes an goes. Its on patients back, scalp. X years. It itches really bad. /Patient went to pcp around thanksgiving and got prednisone taper. They also gave steroid cream(triamcinolone) which did not help. It came back after the first prednisone taper and she is now on another taper. It is red patient has some photos of the rash. ).  Intensely itchy rash, treated with 2 courses of oral prednisone.  Has photographs to show rash when it was more active. Location:  Duration:  Quality:  Associated Signs/Symptoms: Modifying Factors:  Severity:  Timing: Context:    The following portions of the chart were reviewed this encounter and updated as appropriate:  Tobacco   Allergies   Meds   Problems   Med Hx   Surg Hx   Fam Hx       Objective  Well appearing patient in no apparent distress; mood and affect are within normal limits. Photographs more than examination today (she is still on a prednisone taper) papular to urticarial dermatitis.  Back, Left Lower Leg, post scalp I explained the potential origins of symmetric itchy red rashes; she was told that most are not due to to infection or noncontagious, but do involve the immune system triggered either by external origin or autoimmune (I would favor an endogenous process like subacute prurigo in this case.  Dermatographism test - Negative   Dapsone new start  REPEAT CBC BEFORE INCREASING DAPSONE EVERY 7-10 DAYS PENDING BLOODWORK STARTING AT 25MG , INCREASING TO 50MG     A full examination was performed including scalp, head, eyes, ears, nose, lips, neck, chest, axillae, abdomen, back, buttocks, bilateral upper extremities, bilateral lower extremities, hands, feet, fingers, toes, fingernails, and toenails. All findings within normal limits unless otherwise noted below.  Areas beneath undergarments not fully examined.  No  mucositis or cellulitis.   Assessment & Plan  Rash and other nonspecific skin eruption  I discussed essentially all treatment options detailed by prednisone has a undesirable long-term strategy.  She may be a candidate for Dupixent but I would first try 4 to 6 weeks of oral dapsone.  All potential risks detailed.  Dapsone new start  Related Procedures CMP Sedimentation Rate Glucose 6 phosphate dehydrogenase CBC with Differential/Platelet  Encounter for therapeutic drug monitoring  Related Procedures CMP Sedimentation Rate Glucose 6 phosphate dehydrogenase CBC with Differential/Platelet

## 2021-08-12 DIAGNOSIS — R21 Rash and other nonspecific skin eruption: Secondary | ICD-10-CM | POA: Diagnosis not present

## 2021-08-12 LAB — CBC WITH DIFFERENTIAL/PLATELET
Absolute Monocytes: 580 cells/uL (ref 200–950)
Basophils Absolute: 129 cells/uL (ref 0–200)
Basophils Relative: 2.8 %
Eosinophils Absolute: 791 cells/uL — ABNORMAL HIGH (ref 15–500)
Eosinophils Relative: 17.2 %
HCT: 40.1 % (ref 35.0–45.0)
Hemoglobin: 12.9 g/dL (ref 11.7–15.5)
Lymphs Abs: 971 cells/uL (ref 850–3900)
MCH: 27.6 pg (ref 27.0–33.0)
MCHC: 32.2 g/dL (ref 32.0–36.0)
MCV: 85.7 fL (ref 80.0–100.0)
MPV: 10.4 fL (ref 7.5–12.5)
Monocytes Relative: 12.6 %
Neutro Abs: 2130 cells/uL (ref 1500–7800)
Neutrophils Relative %: 46.3 %
Platelets: 254 10*3/uL (ref 140–400)
RBC: 4.68 10*6/uL (ref 3.80–5.10)
RDW: 12.8 % (ref 11.0–15.0)
Total Lymphocyte: 21.1 %
WBC: 4.6 10*3/uL (ref 3.8–10.8)

## 2021-08-13 ENCOUNTER — Telehealth: Payer: Self-pay

## 2021-08-13 NOTE — Telephone Encounter (Signed)
-----   Message from Lavonna Monarch, MD sent at 08/13/2021  5:00 AM EST ----- She is okay to increase her dapsone to 2 daily (25 mg each).

## 2021-08-13 NOTE — Telephone Encounter (Signed)
Patient aware she can up the does message sent to Dr Denna Haggard about the Eosinophils Absolute being [phone]

## 2021-08-17 NOTE — Telephone Encounter (Signed)
Eosinophils are sometimes known as allergy cells.  This is only a modest increase in eosinophil levels which are commonly seen with any widespread rash.  With profound increase in this cell type there are other conditions to consider but I do not believe evaluation for these is indicated at present.   MtChart message sent

## 2021-08-31 ENCOUNTER — Encounter: Payer: Self-pay | Admitting: Dermatology

## 2021-08-31 NOTE — Progress Notes (Signed)
° °  Follow-Up Visit   Subjective  [name] is a 61 y.o. female who presents for the following: Rash (Pt here for rash on the occipital scalp extending down the back and post neck. Pt would like bx).  No improvement in rash, itch may actually be worse Location:  Duration:  Quality:  Associated Signs/Symptoms: Modifying Factors:  Severity:  Timing: Context:   Objective  Well appearing patient in no apparent distress; mood and affect are within normal limits. Torso - Posterior (Back) Patient still suffering from intense itching with patchy and variable rash.  Neither of [place] can pinpoint to a specific etiology.  Examination shows fairly nonspecific urticarial dermatitis mainly on neck and back.  No adenopathy or mucositis.    All skin waist up examined.  Areas beneath undergarments not fully examined   Assessment & Plan    Rash and other nonspecific skin eruption Torso - Posterior (Back)  We will add topical clobetasol that she has a history of being on prednisone in the past).  If CBC is normal we will incrementally increase her dapsone.  She continued low-dose doxepin presleep to try to reduce itching (avoid driving and activities that require alertness).  Currently I think biopsy would be a low yield procedure but if there is no improvement we will consider this along with trying to obtain Dupixent.  Related Procedures CBC with Differential/Platelet  Related Medications doxepin (SINEQUAN) 10 MG capsule Take 1 capsule (10 mg total) by mouth at bedtime.  Clobetasol Prop Emollient Base (CLOBETASOL PROPIONATE E) 0.05 % emollient cream APPLY TO THE AFFECTED AREAS ON THE BODY AFTER THE SHOWER TWICE DAILY  Encounter for therapeutic drug monitoring Mid Back  Related Procedures CBC with Differential/Platelet      I, Lavonna Monarch, MD, have reviewed all documentation for this visit.  The documentation on 08/31/21 for the exam, diagnosis, procedures, and orders are all  accurate and complete.

## 2021-09-09 ENCOUNTER — Ambulatory Visit: Payer: BC Managed Care – PPO | Admitting: Dermatology

## 2021-10-12 DIAGNOSIS — J019 Acute sinusitis, unspecified: Secondary | ICD-10-CM | POA: Diagnosis not present

## 2022-01-13 DIAGNOSIS — Z13220 Encounter for screening for lipoid disorders: Secondary | ICD-10-CM | POA: Diagnosis not present

## 2022-01-13 DIAGNOSIS — Z131 Encounter for screening for diabetes mellitus: Secondary | ICD-10-CM | POA: Diagnosis not present

## 2022-01-13 DIAGNOSIS — Z124 Encounter for screening for malignant neoplasm of cervix: Secondary | ICD-10-CM | POA: Diagnosis not present

## 2022-01-13 DIAGNOSIS — Z1151 Encounter for screening for human papillomavirus (HPV): Secondary | ICD-10-CM | POA: Diagnosis not present

## 2022-01-13 DIAGNOSIS — Z1329 Encounter for screening for other suspected endocrine disorder: Secondary | ICD-10-CM | POA: Diagnosis not present

## 2022-01-13 DIAGNOSIS — Z01419 Encounter for gynecological examination (general) (routine) without abnormal findings: Secondary | ICD-10-CM | POA: Diagnosis not present

## 2022-01-13 DIAGNOSIS — Z13228 Encounter for screening for other metabolic disorders: Secondary | ICD-10-CM | POA: Diagnosis not present

## 2022-01-13 DIAGNOSIS — Z6823 Body mass index (BMI) 23.0-23.9, adult: Secondary | ICD-10-CM | POA: Diagnosis not present

## 2022-01-13 DIAGNOSIS — Z1321 Encounter for screening for nutritional disorder: Secondary | ICD-10-CM | POA: Diagnosis not present

## 2022-02-25 DIAGNOSIS — Z13820 Encounter for screening for osteoporosis: Secondary | ICD-10-CM | POA: Diagnosis not present

## 2022-02-25 DIAGNOSIS — Z1231 Encounter for screening mammogram for malignant neoplasm of breast: Secondary | ICD-10-CM | POA: Diagnosis not present

## 2022-03-01 ENCOUNTER — Other Ambulatory Visit: Payer: Self-pay | Admitting: Obstetrics and Gynecology

## 2022-03-01 DIAGNOSIS — R928 Other abnormal and inconclusive findings on diagnostic imaging of breast: Secondary | ICD-10-CM

## 2022-03-08 ENCOUNTER — Other Ambulatory Visit: Payer: Self-pay | Admitting: Obstetrics and Gynecology

## 2022-03-08 ENCOUNTER — Ambulatory Visit
Admission: RE | Admit: 2022-03-08 | Discharge: 2022-03-08 | Disposition: A | Discharge status: Home / Self Care | Payer: BC Managed Care – PPO | Source: Ambulatory Visit | Attending: Obstetrics and Gynecology | Admitting: Obstetrics and Gynecology

## 2022-03-08 DIAGNOSIS — N6489 Other specified disorders of breast: Secondary | ICD-10-CM

## 2022-03-08 DIAGNOSIS — R928 Other abnormal and inconclusive findings on diagnostic imaging of breast: Secondary | ICD-10-CM

## 2022-03-08 DIAGNOSIS — Z853 Personal history of malignant neoplasm of breast: Secondary | ICD-10-CM | POA: Diagnosis not present

## 2022-03-09 DIAGNOSIS — R945 Abnormal results of liver function studies: Secondary | ICD-10-CM | POA: Diagnosis not present

## 2022-03-09 DIAGNOSIS — E559 Vitamin D deficiency, unspecified: Secondary | ICD-10-CM | POA: Diagnosis not present

## 2022-03-11 ENCOUNTER — Encounter: Payer: Self-pay | Admitting: Nurse Practitioner

## 2022-03-19 ENCOUNTER — Ambulatory Visit
Admission: RE | Admit: 2022-03-19 | Discharge: 2022-03-19 | Disposition: A | Discharge status: Home / Self Care | Payer: BC Managed Care – PPO | Source: Ambulatory Visit | Attending: Obstetrics and Gynecology | Admitting: Obstetrics and Gynecology

## 2022-03-19 DIAGNOSIS — N6489 Other specified disorders of breast: Secondary | ICD-10-CM

## 2022-03-19 DIAGNOSIS — R928 Other abnormal and inconclusive findings on diagnostic imaging of breast: Secondary | ICD-10-CM | POA: Diagnosis not present

## 2022-03-19 DIAGNOSIS — N6031 Fibrosclerosis of right breast: Secondary | ICD-10-CM | POA: Diagnosis not present

## 2022-04-14 NOTE — Progress Notes (Addendum)
04/14/2022 [name] [id number] March 12, 1961   CHIEF COMPLAINT: GERD, elevated LFTs   HISTORY OF PRESENT ILLNESS:  [name] is a 61 year old female with a past medical history of anxiety, breast cancer s/p lumpectomy 2007 treated with chemo and radiation, GERD, Barrett's esophagus 2005 and Irritable bowel syndrome. She is followed by Dr. Carlean Purl.  She presents today as referred by Dr. Arvella Nigh for further evaluation regarding GERD and elevated LFTs. She describes having worsening acid reflux symptoms for the past 4 to 5 months which includes wheezing and coughing episodes triggered by eating. A few times, she coughs up food when having active wheezing. No specific food triggers. She has clear to yellow thin to thick post nasal drainage/mucous in her throat. No classic heartburn. She has intermittent dysphagia, spasm type upper esophageal discomfort when eating. She has a "froggy weak voice" for the past year. She takes Pepcid AC otc once daily. In the past, she developed worsening abdominal pain on Esomeprazole so she stopped taking it.  Her most recent EGD was 04/13/2017 which showed grade a reflux esophagitis without evidence of eosinophilic esophagitis or Barrett's esophagus. No significant upper or lower abdominal pain but has "twinges" of discomfort to the left side area, occurs daily for the past few months. She takes Zyrtec or Claritin daily. She took Advil 3 tabs po daily x 1 year for generalized aches and pains then reduced Advil to 2 tabs 3 to 4 days weekly about 2 weeks ago. Father had esophageal cancer with history of tobacco and alcohol use disorder.  Maternal grandfather had colon cancer.  She has a dermatological disorder of unclear etiology followed by dermatology. She has itchy spots on the back of her head/scalp and back. Sometimes gets tiny vesicles on arms which appears like poison ivy and gets worse if she scratches these areas. Previously treated with Prednisone,  topical steroids, Doxepin and antihistamines.  She reported her gynecologist Dr.  Radene Knee recently did labs which showed elevated LFTs normal hep B/C serologies. These labs are not accessible via Epic or care everywhere, results requested but were not received during patient's appointment time today.  Labs 07/21/2021 showed AST level of 44 and ALT 32.  Abdominal imaging has not been done. She drinks 1 glass of wine most evenings.  No family history of liver disease.   EGD 04/13/2017: - LA Grade A reflux esophagitis. Biopsied. 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 Biopsied. - The examination was otherwise normal.  Colonoscopy 04/13/2017: - The perianal and digital rectal examinations were normal. - The colon (entire examined portion) appeared normal. - No additional abnormalities were found on retroflexion. - Anal papilla(e) were hypertrophied. - 10 year colonoscopy   Diagnosis 1. Surgical [P], distal esophagus - REFLUX CHANGES. - NO INTESTINAL METAPLASIA, DYSPLASIA, OR MALIGNANCY. 2. Surgical [P], mid esophagus and distal esophagus - BENIGN SQUAMOUS MUCOSA. - NO INCREASE IN INTRAEPITHELIAL EOSINOPHILS. - NO INTESTINAL METAPLASIA, DYSPLASIA, OR MALIGNANCY     Latest Ref Rng & Units 08/12/2021    7:54 AM 07/21/2021    8:04 AM 02/14/2017   12:58 PM  CBC  WBC 3.8 - 10.8 Thousand/uL 4.6  5.2  4.4   Hemoglobin 11.7 - 15.5 g/dL 12.9  13.3  11.9   Hematocrit 35.0 - 45.0 % 40.1  41.8  37.0   Platelets 140 - 400 Thousand/uL 254  294  274        Latest Ref Rng & Units 07/21/2021  8:04 AM 02/14/2017   12:58 PM 05/02/2014    8:29 AM  CMP  Glucose 65 - 139 mg/dL 91  118  91   BUN 7 - 25 mg/dL 10  10  12.9   Creatinine 0.50 - 1.03 mg/dL 0.78  0.78  0.8   Sodium 135 - 146 mmol/L 138  139  140   Potassium 3.5 - 5.3 mmol/L 3.8  4.6  4.5   Chloride 98 - 110 mmol/L 102  103    CO2 20 - 32 mmol/L $RemoveB'31  28  27   '[id number]$ Calcium 8.6 - 10.4 mg/dL 8.8  9.9  9.9   Total Protein  6.1 - 8.1 g/dL 6.1   7.2   Total Bilirubin 0.2 - 1.2 mg/dL 1.0   0.45   Alkaline Phos 40 - 150 U/L   53   AST 10 - 35 U/L 44   19   ALT 6 - 29 U/L 32   16       Past Medical History:  Diagnosis Date   Anxiety    Arm pain, left    Barrett's esophagus    Breast cancer (HCC) 45   hx   Chest discomfort    GERD (gastroesophageal reflux disease)    Hemorrhoids, internal    Hyperlipidemia    IBS (irritable bowel syndrome)    Palpitations    Personal history of radiation therapy    Schatzki's ring    hx   Past Surgical History:  Procedure Laterality Date   BREAST LUMPECTOMY Right 2007   with sentinel node dissection   COLONOSCOPY  01/2004   internal hemorrhoids   ESOPHAGOGASTRODUODENOSCOPY  01/2004, 02/2007   2006 Short Barrett's and 2008 not identified   HYSTEROSCOPY DIAGNOSTIC  04/06/2011   OVARIAN CYST REMOVAL     PORTACATH PLACEMENT  2007   removed    Social History: She is single. She has 3 daughters. She is a Herbalist. She quit smoking cigarettes 36 years ago. She drinks 1 to 2 alcoholic beverages daily.   Family History: family history includes Brain cancer in her paternal uncle; Breast cancer in her maternal grandmother; Cervical cancer (age of onset: 28) in her mother; Colon cancer in her maternal grandfather; Diabetes type I in her daughter; Esophageal cancer (age of onset: 49) in her father; Lung cancer (age of onset: 83) in her maternal grandmother; Melanoma (age of onset: 26) in her maternal aunt; Ovarian cancer in her cousin.  Allergies  Allergen Reactions   Citalopram Hydrobromide Other (See Comments)    Burning mouth   Penicillins Hives    CHILDHOOD ALLERGY   Adhesive [Tape] Rash    And blisters   Latex Rash    Rash and blisters      Outpatient Encounter Medications as of 04/15/2022  Medication Sig   ALPRAZolam (XANAX) 0.5 MG tablet    cetirizine (ZYRTEC) 10 MG chewable tablet Chew 10 mg by mouth daily.   Clobetasol Prop Emollient Base (CLOBETASOL  PROPIONATE E) 0.05 % emollient cream APPLY TO THE AFFECTED AREAS ON THE BODY AFTER THE SHOWER TWICE DAILY   diphenhydrAMINE (BENADRYL) 25 MG tablet Take 25 mg by mouth every 6 (six) hours as needed.   diphenhydramine-acetaminophen (TYLENOL PM) 25-500 MG TABS Take 1 tablet by mouth at bedtime as needed.   doxepin (SINEQUAN) 10 MG capsule Take 1 capsule (10 mg total) by mouth at bedtime.   doxepin (SINEQUAN) 25 MG capsule    fexofenadine (ALLEGRA) 30 MG/5ML suspension Take  30 mg by mouth daily.   fluticasone (FLONASE) 50 MCG/ACT nasal spray Place 2 sprays into both nostrils daily.   hyoscyamine (LEVSIN SL) 0.125 MG SL tablet Place 1-2 tablets (0.125-0.25 mg total) under the tongue every 4 (four) hours as needed.   ibuprofen (ADVIL,MOTRIN) 200 MG tablet Take 400 mg by mouth daily as needed for pain (takes about 3 times/week).   loratadine (CLARITIN) 10 MG tablet    nitrofurantoin, macrocrystal-monohydrate, (MACROBID) 100 MG capsule nitrofurantoin monohydrate/macrocrystals 100 mg capsule   predniSONE (DELTASONE) 10 MG tablet Take by mouth.   predniSONE (STERAPRED UNI-PAK 21 TAB) 10 MG (21) TBPK tablet Take by mouth.   triamcinolone cream (KENALOG) 0.1 % Apply topically.   No facility-administered encounter medications on file as of 04/15/2022.    REVIEW OF SYSTEMS:  Gen: +Fatigue. Denies fever, sweats or chills. No weight loss.  CV: Denies chest pain, palpitations or edema. Resp: + Cough. No shortness of breath or hemoptysis.  GI:See HPI. + Voice changes.  GU : Denies urinary burning, blood in urine, increased urinary frequency or incontinence. MS: Denies joint pain, muscles aches or weakness. Derm: See HPI.  Psych: Denies depression, anxiety or memory loss.  Heme: Denies bruising, easy bleeding. Neuro:  Denies headaches, dizziness or paresthesias. Endo:  Denies any problems with DM, thyroid or adrenal function.  PHYSICAL EXAM: LMP 03/15/2011  BP 122/68   Pulse 96   Ht 5' 4.5" (1.638 m)    Wt 136 lb (61.7 kg)   LMP 03/15/2011   BMI 22.98 kg/m   Wt Readings from Last 3 Encounters:  04/15/22 136 lb (61.7 kg)  07/06/17 131 lb 9.6 oz (59.7 kg)  04/13/17 126 lb (57.2 kg)    General: 61 year old female in no acute distress. Head: Normocephalic and atraumatic. Eyes:  Sclerae non-icteric, conjunctive pink. Ears: Normal auditory acuity. Mouth: Dentition intact. No ulcers or lesions.  Neck: Supple, no lymphadenopathy or thyromegaly.  Lungs: Clear bilaterally to auscultation without wheezes, crackles or rhonchi. Heart: Regular rate and rhythm. No murmur, rub or gallop appreciated.  Abdomen: Soft, nontender, non distended. No masses. No hepatosplenomegaly. Normoactive bowel sounds x 4 quadrants.  Rectal: Deferred. Musculoskeletal: Symmetrical with no gross deformities. Skin: No rash or vesicles on examined skin surfaces. Extremities: No edema. Neurological: Alert oriented x 4, no focal deficits.  Psychological:  Alert and cooperative. Normal mood and affect.  ASSESSMENT AND PLAN:  54) 61 year old female with a remote history of Barrett's esophagus for EGD in 2005, GERD and dysphagia.  Father with history of esophageal cancer.  Her most recent EGD 04/13/2017 identified grade a reflux esophagitis without evidence of Barrett's esophagus or eosinophilic esophagitis. -EGD to rule out barrett's esophagus, reflux esophagitis, eosinophilic esophagitis and UGI malignancy, benefits and risks discussed including risk with sedation, risk of bleeding, perforation and infection  -GERD diet reviewed -Pepcid AC 1 tablet once daily, may increase to twice daily -Continue to reduce NSAID use  2) Questionable laryngospasms induced by acid reflux vs allergic response -Proceed with ENT consult -Food allergy panel -EGD as ordered above to rule out reflux esophagitis which could potentially trigger laryngospasms  3) Dermatologic disorder of unclear etiology -TTG, IGA -EGD as ordered above to  rule out celiac disease  -Continue follow-up with dermatology -Consider allergist consult  4) Elevated LFTs with pruritis -Request copy of most recent LFTs, hepatitis B and hepatitis C serologies from Dr. Radene Knee.  Once I have reviewed these labs, I will order additional hepatology serologies to rule out autoimmune liver  disorders, Wilson's dz and celiac dz.  -RUQ abdominal sonogram  5) Colon cancer screening.  Colonoscopy 04/13/2017 showed a normal colon, no polyps. -Next colonoscopy due 04/2027  6) Right and left abdominal spasm like pain -Hyoscyamine 0.125 mg 1 tab dissolved under tongue every 6 to 8 hours as needed  7) History of breast cancer s/p right lumpectomy 2007 with chemo and radiation   ADDENDUM: Requested labs received from Dr. Ophelia Charter office.  Labs 03/09/2022: Total bili 0.4.  Direct bili 0.14.  Alk phos 73.  AST 68.  ALT 33.  Hepatitis B surface antigen negative.  Hepatitis C antibody nonreactive.  Labs 01/18/2022: AST 74.  ALT 40.  Patient will be contacted to undergo additional laboratory studies to include food allergy panel, TTG, IgA, ANA, AMA, SMA, IgG, GGT, ceruloplasmin, alpha 1 antitrypsin, iron, ferritin, hepatitis A total antibody, hepatitis B core total antibody and hepatitis B surface antibody.     CC:  Arvella Nigh, MD

## 2022-04-15 ENCOUNTER — Ambulatory Visit (INDEPENDENT_AMBULATORY_CARE_PROVIDER_SITE_OTHER): Payer: BC Managed Care – PPO | Admitting: Nurse Practitioner

## 2022-04-15 ENCOUNTER — Encounter: Payer: Self-pay | Admitting: Nurse Practitioner

## 2022-04-15 VITALS — BP 122/68 | HR 96 | Ht 64.5 in | Wt 136.0 lb

## 2022-04-15 DIAGNOSIS — R7989 Other specified abnormal findings of blood chemistry: Secondary | ICD-10-CM

## 2022-04-15 DIAGNOSIS — K219 Gastro-esophageal reflux disease without esophagitis: Secondary | ICD-10-CM | POA: Diagnosis not present

## 2022-04-15 DIAGNOSIS — R1310 Dysphagia, unspecified: Secondary | ICD-10-CM | POA: Diagnosis not present

## 2022-04-15 MED ORDER — HYOSCYAMINE SULFATE 0.125 MG SL SUBL: SUBLINGUAL_TABLET | SUBLINGUAL | 1 refills | Status: DC

## 2022-04-15 NOTE — Patient Instructions (Signed)
You have been scheduled for an endoscopy. Please follow written instructions given to you at your visit today. If you use inhalers (even only as needed), please bring them with you on the day of your procedure.  _______________________________________________________   If you are age 59 or younger, your body mass index should be between 19-25. Your Body mass index is 22.98 kg/m. If this is out of the aformentioned range listed, please consider follow up with your Primary Care Provider.   ________________________________________________________  The [facility] providers would like to encourage you to use [facility] to communicate with providers for non-urgent requests or questions.  Due to long hold times on the telephone, sending your provider a message by [facility] may be a faster and more efficient way to get a response.  Please allow 48 business hours for a response.  Please remember that this is for non-urgent requests.  _______________________________________________________   Dennis Bast have been scheduled for an abdominal ultrasound at [facility] (1st floor of hospital) on _______________ at ___________. Please arrive 30 minutes prior to your appointment for registration. Make certain not to have anything to eat or drink 6 hours prior to your appointment. Should you need to reschedule your appointment, please contact radiology at [phone]. This test typically takes about 30 minutes to perform.   You have been scheduled for a Barium Esophogram at [facility] (1st floor of the hospital) on _____________ at ______________. Please arrive 30 minutes prior to your appointment for registration. Make certain not to have anything to eat or drink 3 hours prior to your test. If you need to reschedule for any reason, please contact radiology at [phone] to do so. __________________________________________________________________ A barium swallow is an examination that concentrates on  views of the esophagus. This tends to be a double contrast exam (barium and two liquids which, when combined, create a gas to distend the wall of the oesophagus) or single contrast (non-ionic iodine based). The study is usually tailored to your symptoms so a good history is essential. Attention is paid during the study to the form, structure and configuration of the esophagus, looking for functional disorders (such as aspiration, dysphagia, achalasia, motility and reflux) EXAMINATION You may be asked to change into a gown, depending on the type of swallow being performed. A radiologist and radiographer will perform the procedure. The radiologist will advise you of the type of contrast selected for your procedure and direct you during the exam. You will be asked to stand, sit or lie in several different positions and to hold a small amount of fluid in your mouth before being asked to swallow while the imaging is performed .In some instances you may be asked to swallow barium coated marshmallows to assess the motility of a solid food bolus. The exam can be recorded as a digital or video fluoroscopy procedure. POST PROCEDURE It will take 1-2 days for the barium to pass through your system. To facilitate this, it is important, unless otherwise directed, to increase your fluids for the next 24-48hrs and to resume your normal diet.  This test typically takes about 30 minutes to perform. __________________________________________________________________________________  Dennis Bast will be contacted by [facility] in the next 2 days to arrange a Ultrasound and a Barium Swallow with tablet..  The number on your caller ID will be [phone], please answer when they call.  If you have not heard from them in 2 days please call [phone] to schedule.     We have sent  the following medications to your pharmacy for you to pick up at your convenience: Generic levsin  Continue your over the counter  Pepcid AC : Take one tablet daily and may increase to twice a day if needed.   I appreciate the opportunity to care for you. [place] , [place]    .

## 2022-04-16 ENCOUNTER — Encounter: Payer: Self-pay | Admitting: Nurse Practitioner

## 2022-04-19 ENCOUNTER — Other Ambulatory Visit: Payer: Self-pay

## 2022-04-19 ENCOUNTER — Telehealth: Payer: Self-pay | Admitting: Nurse Practitioner

## 2022-04-19 DIAGNOSIS — H35373 Puckering of macula, bilateral: Secondary | ICD-10-CM | POA: Diagnosis not present

## 2022-04-19 DIAGNOSIS — R7989 Other specified abnormal findings of blood chemistry: Secondary | ICD-10-CM

## 2022-04-19 DIAGNOSIS — H524 Presbyopia: Secondary | ICD-10-CM | POA: Diagnosis not present

## 2022-04-19 DIAGNOSIS — H5213 Myopia, bilateral: Secondary | ICD-10-CM | POA: Diagnosis not present

## 2022-04-19 DIAGNOSIS — K219 Gastro-esophageal reflux disease without esophagitis: Secondary | ICD-10-CM

## 2022-04-19 DIAGNOSIS — L309 Dermatitis, unspecified: Secondary | ICD-10-CM

## 2022-04-19 DIAGNOSIS — H2513 Age-related nuclear cataract, bilateral: Secondary | ICD-10-CM | POA: Diagnosis not present

## 2022-04-19 NOTE — Telephone Encounter (Signed)
Pt was made aware of Carl Best NP recommendations:  Orders for labs placed in Epic: Pt made aware: Location to lab  given:  Pt verbalized understanding with all questions answered.

## 2022-04-19 NOTE — Telephone Encounter (Signed)
[name], please contact patient and let her know I received her lab results from Dr. Renold Don.  Please send her to our lab for additional laboratory studies to include the following:  Food allergy panel, TTG, IgA, ANA, AMA, SMA, IgG, GGT, ceruloplasmin, alpha 1 antitrypsin, iron, ferritin, hepatitis A total antibody, hepatitis B core total antibody and hepatitis B surface antibody.  DX: Elevated LFTs, GERD/LPR, dermatitis (unclear etiology). THX

## 2022-04-20 ENCOUNTER — Other Ambulatory Visit (INDEPENDENT_AMBULATORY_CARE_PROVIDER_SITE_OTHER): Payer: BC Managed Care – PPO

## 2022-04-20 DIAGNOSIS — L309 Dermatitis, unspecified: Secondary | ICD-10-CM

## 2022-04-20 DIAGNOSIS — R7989 Other specified abnormal findings of blood chemistry: Secondary | ICD-10-CM

## 2022-04-20 DIAGNOSIS — K219 Gastro-esophageal reflux disease without esophagitis: Secondary | ICD-10-CM | POA: Diagnosis not present

## 2022-04-20 LAB — GAMMA GT: GGT: 41 U/L (ref 7–51)

## 2022-04-20 LAB — IRON: Iron: 88 ug/dL (ref 42–145)

## 2022-04-20 LAB — FERRITIN: Ferritin: 78.8 ng/mL (ref 10.0–291.0)

## 2022-04-22 ENCOUNTER — Ambulatory Visit (HOSPITAL_COMMUNITY)
Admission: RE | Admit: 2022-04-22 | Discharge: 2022-04-22 | Disposition: A | Discharge status: Home / Self Care | Payer: BC Managed Care – PPO | Source: Ambulatory Visit | Attending: Nurse Practitioner | Admitting: Nurse Practitioner

## 2022-04-22 DIAGNOSIS — R7989 Other specified abnormal findings of blood chemistry: Secondary | ICD-10-CM | POA: Insufficient documentation

## 2022-04-22 DIAGNOSIS — R1310 Dysphagia, unspecified: Secondary | ICD-10-CM | POA: Insufficient documentation

## 2022-04-22 DIAGNOSIS — K219 Gastro-esophageal reflux disease without esophagitis: Secondary | ICD-10-CM | POA: Insufficient documentation

## 2022-04-22 DIAGNOSIS — K224 Dyskinesia of esophagus: Secondary | ICD-10-CM | POA: Diagnosis not present

## 2022-04-22 DIAGNOSIS — R945 Abnormal results of liver function studies: Secondary | ICD-10-CM | POA: Diagnosis not present

## 2022-04-24 LAB — FOOD ALLERGY PROFILE
Allergen, Salmon, f41: <0.1 kU/L
Almonds: <0.1 kU/L
CLASS: 0
CLASS: 0
CLASS: 0
CLASS: 0
CLASS: 0
CLASS: 0
CLASS: 0
CLASS: 0
Cashew IgE: <0.1 kU/L
Class: 0
Class: 0
Class: 0
Egg White IgE: <0.1 kU/L
Fish Cod: <0.1 kU/L
Hazelnut: 0.12 kU/L — ABNORMAL HIGH
Milk IgE: <0.1 kU/L
Peanut IgE: 0.24 kU/L — ABNORMAL HIGH
Scallop IgE: <0.1 kU/L
Sesame Seed f10: 0.17 kU/L — ABNORMAL HIGH
Shrimp IgE: <0.1 kU/L
Soybean IgE: <0.1 kU/L
Tuna IgE: <0.1 kU/L
Walnut: <0.1 kU/L
Wheat IgE: 0.16 kU/L — ABNORMAL HIGH

## 2022-04-24 LAB — IGA: Immunoglobulin A: 142 mg/dL (ref 47–310)

## 2022-04-24 LAB — CERULOPLASMIN: Ceruloplasmin: 29 mg/dL (ref 18–53)

## 2022-04-24 LAB — TISSUE TRANSGLUTAMINASE ABS,IGG,IGA
(tTG) Ab, IgA: <1 U/mL
(tTG) Ab, IgG: <1 U/mL

## 2022-04-24 LAB — ANA: Anti Nuclear Antibody (ANA): NEGATIVE

## 2022-04-24 LAB — ANTI-SMOOTH MUSCLE ANTIBODY, IGG: Actin (Smooth Muscle) Antibody (IGG): <20 U (ref <20)

## 2022-04-24 LAB — MITOCHONDRIAL ANTIBODIES: Mitochondrial M2 Ab, IgG: <=20 U (ref <=20.0)

## 2022-04-24 LAB — ALPHA-1-ANTITRYPSIN: A-1 Antitrypsin, Ser: 138 mg/dL (ref 83–199)

## 2022-04-24 LAB — INTERPRETATION:

## 2022-04-24 LAB — HEPATITIS A ANTIBODY, TOTAL: Hepatitis A AB,Total: NONREACTIVE

## 2022-04-24 LAB — HEPATITIS B SURFACE ANTIBODY,QUALITATIVE: Hep B S Ab: NONREACTIVE

## 2022-04-24 LAB — HEPATITIS B CORE ANTIBODY, TOTAL: Hep B Core Total Ab: NONREACTIVE

## 2022-04-26 ENCOUNTER — Other Ambulatory Visit: Payer: Self-pay

## 2022-04-26 DIAGNOSIS — R7989 Other specified abnormal findings of blood chemistry: Secondary | ICD-10-CM

## 2022-04-26 DIAGNOSIS — K219 Gastro-esophageal reflux disease without esophagitis: Secondary | ICD-10-CM

## 2022-04-29 DIAGNOSIS — F4323 Adjustment disorder with mixed anxiety and depressed mood: Secondary | ICD-10-CM | POA: Diagnosis not present

## 2022-05-03 DIAGNOSIS — Z23 Encounter for immunization: Secondary | ICD-10-CM | POA: Diagnosis not present

## 2022-05-03 DIAGNOSIS — Z91018 Allergy to other foods: Secondary | ICD-10-CM | POA: Diagnosis not present

## 2022-05-03 DIAGNOSIS — R7401 Elevation of levels of liver transaminase levels: Secondary | ICD-10-CM | POA: Diagnosis not present

## 2022-05-03 DIAGNOSIS — L509 Urticaria, unspecified: Secondary | ICD-10-CM | POA: Diagnosis not present

## 2022-05-13 DIAGNOSIS — F4320 Adjustment disorder, unspecified: Secondary | ICD-10-CM | POA: Diagnosis not present

## 2022-05-18 ENCOUNTER — Encounter: Payer: BC Managed Care – PPO | Admitting: Internal Medicine

## 2022-06-17 ENCOUNTER — Ambulatory Visit (INDEPENDENT_AMBULATORY_CARE_PROVIDER_SITE_OTHER): Payer: BC Managed Care – PPO | Admitting: Allergy

## 2022-06-17 ENCOUNTER — Other Ambulatory Visit: Payer: Self-pay

## 2022-06-17 ENCOUNTER — Encounter: Payer: Self-pay | Admitting: Allergy

## 2022-06-17 VITALS — BP 122/82 | HR 67 | Temp 98.0°F | Resp 18 | Ht 64.25 in | Wt 142.8 lb

## 2022-06-17 DIAGNOSIS — L2481 Irritant contact dermatitis due to metals: Secondary | ICD-10-CM

## 2022-06-17 DIAGNOSIS — R21 Rash and other nonspecific skin eruption: Secondary | ICD-10-CM | POA: Diagnosis not present

## 2022-06-17 DIAGNOSIS — J301 Allergic rhinitis due to pollen: Secondary | ICD-10-CM

## 2022-06-17 MED ORDER — CLOBETASOL PROP EMOLLIENT BASE 0.05 % EX CREA: TOPICAL_CREAM | CUTANEOUS | 3 refills | Status: DC

## 2022-06-17 MED ORDER — FLUOCINOLONE ACETONIDE SCALP 0.01 % EX OIL: 1.0000 | TOPICAL_OIL | Freq: Every day | CUTANEOUS | 3 refills | Status: DC | PRN

## 2022-06-17 MED ORDER — RYALTRIS 665-25 MCG/ACT NA SUSP: NASAL | 5 refills | Status: DC

## 2022-06-17 NOTE — Progress Notes (Signed)
New Patient Note  RE: [name] MRN: [id number] DOB: Jul 02, 1961 Date of Office Visit: 06/17/2022  Primary care provider:Sun, Gari Crown, MD  Chief Complaint: rash  History of present illness: [name] is a 61 y.o. female presenting today for evaluation of dermatitis.   She states she has had a really bad rash for past 2 years that comes and goes. She was seeing a dermatologist that has since retired.  She was treated with clobetasol that helps.  Prednisone also has helped.  She states the rash can occur every where.  She states dermatologist at sometime did try dapsone as thought she may have an autoimmune type rash. She states the rash can look like blisters when it appears almost like she had poison ivy and it is extremely itchy.   She states her scalp is always itchy and she has scabs from scratching.   She is not sure if she there is a contact component to rash as sometimes she can lay on something and have rash develop in that area.   She states she knows she has a metal allergy as states jewelry started to cause rash that can blister.  She states has even has had issues with sterling silver jewerly.   She does have a new dermatologist at [facility] and has an appt next week.  She never had any biopsy with previous dermatologist.  She has had blood work done by PCP involving food allergy panel.  She states she had allergy testing that showed peanut, wheat, sesame and hazelnut that was positive on testing about 2 months ago.  She did try to cut these foods from diet but did not note any improvements in her symptoms.   She states she also has PND and she states she can have lots of sinus infections and muffled hearing and has had voice changes.  These symptoms have all been more prevalent since she has been back in her house (she was out of house for 4 years) and she moved back in to finished basement.  She states it is damp.  She has used Human resources officer, claritin, flonase for  allergy symptoms.  She sees GI as she has had history of barrets esophagus.   Review of systems: Review of Systems  Constitutional: Negative.   HENT:         See HPI  Eyes: Negative.   Respiratory: Negative.    Cardiovascular: Negative.   Gastrointestinal: Negative.   Musculoskeletal: Negative.   Skin:        See HPI  Allergic/Immunologic: Negative.   Neurological: Negative.     All other systems negative unless noted above in HPI  Past medical history: Past Medical History:  Diagnosis Date   Anxiety    Arm pain, left    Barrett's esophagus    Breast cancer ([place]) 45   hx   Chest discomfort    GERD (gastroesophageal reflux disease)    Hemorrhoids, internal    Hyperlipidemia    IBS (irritable bowel syndrome)    Palpitations    Personal history of radiation therapy    Schatzki's ring    hx    Past surgical history: Past Surgical History:  Procedure Laterality Date   BREAST LUMPECTOMY Right 2007   with sentinel node dissection   COLONOSCOPY  01/2004   internal hemorrhoids   ESOPHAGOGASTRODUODENOSCOPY  01/2004, 02/2007   2006 Short Barrett's and 2008 not identified   HYSTEROSCOPY DIAGNOSTIC  04/06/2011   OVARIAN CYST  REMOVAL     PORTACATH PLACEMENT  2007   removed     Family history:  Family History  Problem Relation Age of Onset   Esophageal cancer Father 77   Melanoma Maternal Aunt 60   Lung cancer Maternal Grandmother 65       hx of smoking   Breast cancer Maternal Grandmother        dx in her 37s   Cervical cancer Mother 3   Brain cancer Paternal Uncle        dx in his 43s   Colon cancer Maternal Grandfather        early 20s   Ovarian cancer Cousin        dx in her 52s   Diabetes type I Daughter    Coronary artery disease Neg Hx        No absolute premature coronary disease    Social history: Lives in a home without carpeting with gas heating and central cooling.  1 dog and 2 cats in the home.  There is no concern for water damage, mildew or  roaches in the home.  However she does state that basement area is damp.  She is a Herbalist.  She does use help with filter in the home.  She denies smoking history at this time.  Medication List: Current Outpatient Medications  Medication Sig Dispense Refill   ALPRAZolam (XANAX) 0.5 MG tablet      diphenhydrAMINE (BENADRYL) 25 MG tablet Take 25 mg by mouth every 6 (six) hours as needed.     diphenhydramine-acetaminophen (TYLENOL PM) 25-500 MG TABS Take 1 tablet by mouth at bedtime as needed.     famotidine (PEPCID) 20 MG tablet Take 20 mg by mouth daily.     Fluocinolone Acetonide Scalp (DERMA-SMOOTHE/FS SCALP) 0.01 % OIL Apply 1 Application topically daily as needed. 118.28 mL 3   fluticasone (FLONASE) 50 MCG/ACT nasal spray Place 2 sprays into both nostrils daily.     ibuprofen (ADVIL,MOTRIN) 200 MG tablet Take 400 mg by mouth daily as needed for pain (takes about 3 times/week).     loratadine (CLARITIN) 10 MG tablet      RYALTRIS 665-25 MCG/ACT SUSP 2 sprays each nostril 1-2 times daily as needed. 29 g 5   Clobetasol Prop Emollient Base (CLOBETASOL PROPIONATE E) 0.05 % emollient cream APPLY TO THE AFFECTED AREAS ON THE BODY AFTER THE SHOWER TWICE DAILY 60 g 3   No current facility-administered medications for this visit.    Known medication allergies: Allergies  Allergen Reactions   Citalopram Hydrobromide Other (See Comments)    Burning mouth   Penicillins Hives    CHILDHOOD ALLERGY   Adhesive [Tape] Rash    And blisters   Latex Rash    Rash and blisters     Physical examination: Blood pressure 122/82, pulse 67, temperature 98 F (36.7 C), temperature source Temporal, resp. rate 18, height 5' 4.25" (1.632 m), weight 142 lb 12.8 oz (64.8 kg), last menstrual period 03/15/2011, SpO2 98 %.  General: Alert, interactive, in no acute distress. HEENT: PERRLA, TMs pearly gray, turbinates non-edematous without discharge, post-pharynx non erythematous. Neck: Supple without  lymphadenopathy. Lungs: Clear to auscultation without wheezing, rhonchi or rales. {no increased work of breathing. CV: Normal S1, S2 without murmurs. Abdomen: Nondistended, nontender. Skin: Small rough patch on the mid to lower back . Extremities:  No clubbing, cyanosis or edema. Neuro:   Grossly intact.  Diagnositics/Labs: Labs:  Component  Latest Ref Rng 04/20/2022  Egg White IgE     kU/L <0.10   Class 0   Class 0/1   Class 0/1   Class 0   Class 0   Class 0   Class 0/1   Class 0   Class 0/1   Class 0   Class 0   Class 0   Class 0   Class 0   Class 0   Peanut IgE     kU/L 0.24 (H)   Wheat IgE     kU/L 0.16 (H)   Walnut     kU/L <0.10   Fish Cod     kU/L <0.10   Milk IgE     kU/L <0.10   Soybean IgE     kU/L <0.10   Shrimp IgE     kU/L <0.10   Scallop IgE     kU/L <0.10   Sesame Seed IgE     kU/L 0.17 (H)   Hazelnut     kU/L 0.12 (H)   Cashew IgE     kU/L <0.10   Almonds     kU/L <0.10   Allergen, Salmon, f41     kU/L <0.10   Tuna IgE     kU/L <0.10     Allergy testing:   Airborne Adult Perc - 06/17/22 1529     Time Antigen Placed 1529    Allergen Manufacturer Lavella Hammock    Location Back    Number of Test 59    Panel 1 Select    1. Control-Buffer 50% Glycerol Negative    2. Control-Histamine 1 mg/ml 2+    3. Albumin saline Negative    4. Alliance 2+    5. [place] Negative    6. Johnson Negative    7. [place] Blue Negative    8. Meadow Fescue Negative    9. Perennial Rye Negative    10. Sweet Vernal Negative    11. Timothy Negative    12. Cocklebur Negative    13. Burweed Marshelder Negative    14. Ragweed, short Negative    15. Ragweed, Giant Negative    16. Plantain,  English Negative    17. Lamb's Quarters Negative    18. Sheep Sorrell Negative    19. Rough Pigweed Negative    20. Marsh Elder, Rough Negative    21. Mugwort, Common Negative    22. Ash mix Negative    23. Birch mix Negative    24. Beech American Negative    25.  Box, Elder Negative    26. Cedar, red Negative    27. Cottonwood, [place] Negative    28. Elm mix Negative    29. Hickory Negative    30. Maple mix Negative    31. Oak, [place] mix Negative    32. Pecan Pollen 2+    33. Pine mix Negative    34. Sycamore Eastern 2+    35. [place]dale, Black Pollen Negative    36. Alternaria alternata Negative    37. Cladosporium Herbarum Negative    38. Aspergillus mix Negative    39. Penicillium mix Negative    40. Bipolaris sorokiniana (Helminthosporium) Negative    41. Drechslera spicifera (Curvularia) Negative    42. Mucor plumbeus Negative    43. Fusarium moniliforme Negative    44. Aureobasidium pullulans (pullulara) Negative    45. Rhizopus oryzae Negative    46. Botrytis cinera Negative    47. Epicoccum nigrum Negative    48.  Phoma betae Negative    49. Candida Albicans Negative    50. Trichophyton mentagrophytes Negative    51. Mite, D Farinae  5,000 AU/ml Negative    52. Mite, D Pteronyssinus  5,000 AU/ml Negative    53. Cat Hair 10,000 BAU/ml Negative    54.  Dog Epithelia Negative    55. Mixed Feathers Negative    56. Horse Epithelia Negative    57. Cockroach, German Negative    58. Mouse Negative    59. Tobacco Leaf Negative             Allergy testing results were read and interpreted by provider, documented by clinical staff.   Assessment and plan: Dermatitis Contact dermatitis Allergic rhinitis - environmental allergy testing is positive to grass and tree pollens.  Allergen avoidance measures provided - can use antihistamines like Xyzal '5mg'$ , Allegra '180mg'$  or Zyrtec '10mg'$  daily as needed - for nasal congestion/drainage symptoms would use Ryaltris nasal spray 2 sprays each nostril 1-2 times a day as needed.  Sample provided.  - we have discussed the following in regards to foods:   Allergy: food allergy is when you have eaten a food, developed an allergic reaction after eating the food and have IgE to the food (positive  food testing either by skin testing or blood testing).  Food allergy could lead to life threatening symptoms  Sensitivity: occurs when you have IgE to a food (positive food testing either by skin testing or blood testing) but is a food you eat without any issues.  This is not an allergy and we recommend keeping the food in the diet  Intolerance: this is when you have negative testing by either skin testing or blood testing thus not allergic but the food causes symptoms (like belly pain, bloating, diarrhea etc) with ingestion.  These foods should be avoided to prevent symptoms.    - you are not food allergic and can resume peanut, wheat, sesame and hazelnut products in the diet.  You are sensitized to those foods.   - continue as needed use of clobetasol to areas of the body - use derma-smoothe oil on areas of the scalp - keep [place] informed on your new dermatology visit and recommendations/tests performed  - patch testing is the test of choice to evaluate for contact dermatitis.  Recommend performing patch testing with the TRUE test patch panels.  Patches are best placed on a Monday with return to office on Wednesday and Friday of same week for readings.  Once patches are in place to do not get them wet.  You can take antihistamines while patches are in place.   Recommend scheduling for patch testing  I appreciate the opportunity to take part in [name]'s care. Please do not hesitate to contact me with questions.  Sincerely,   Prudy Feeler, MD Allergy/Immunology [facility]

## 2022-06-17 NOTE — Patient Instructions (Addendum)
-   environmental allergy testing is positive to grass and tree pollens.  Allergen avoidance measures provided - can use antihistamines like Xyzal '5mg'$ , Allegra '180mg'$  or Zyrtec '10mg'$  daily as needed - for nasal congestion/drainage symptoms would use Ryaltris nasal spray 2 sprays each nostril 1-2 times a day as needed.  Sample provided.  - we have discussed the following in regards to foods:   Allergy: food allergy is when you have eaten a food, developed an allergic reaction after eating the food and have IgE to the food (positive food testing either by skin testing or blood testing).  Food allergy could lead to life threatening symptoms  Sensitivity: occurs when you have IgE to a food (positive food testing either by skin testing or blood testing) but is a food you eat without any issues.  This is not an allergy and we recommend keeping the food in the diet  Intolerance: this is when you have negative testing by either skin testing or blood testing thus not allergic but the food causes symptoms (like belly pain, bloating, diarrhea etc) with ingestion.  These foods should be avoided to prevent symptoms.    - you are not food allergic and can resume peanut, wheat, sesame and hazelnut products in the diet.  You are sensitized to those foods.   - continue as needed use of clobetasol to areas of the body - use derma-smoothe oil on areas of the scalp - keep [place] informed on your new dermatology visit and recommendations/tests performed  - patch testing is the test of choice to evaluate for contact dermatitis.  Recommend performing patch testing with the TRUE test patch panels.  Patches are best placed on a Monday with return to office on Wednesday and Friday of same week for readings.  Once patches are in place to do not get them wet.  You can take antihistamines while patches are in place.   True Test looks for the following sensitivities:      Recommend scheduling for patch testing

## 2022-06-24 DIAGNOSIS — D2262 Melanocytic nevi of left upper limb, including shoulder: Secondary | ICD-10-CM | POA: Diagnosis not present

## 2022-06-24 DIAGNOSIS — D225 Melanocytic nevi of trunk: Secondary | ICD-10-CM | POA: Diagnosis not present

## 2022-06-24 DIAGNOSIS — L821 Other seborrheic keratosis: Secondary | ICD-10-CM | POA: Diagnosis not present

## 2022-06-24 DIAGNOSIS — D2261 Melanocytic nevi of right upper limb, including shoulder: Secondary | ICD-10-CM | POA: Diagnosis not present

## 2022-08-09 ENCOUNTER — Ambulatory Visit: Payer: BC Managed Care – PPO | Admitting: Family

## 2022-08-11 ENCOUNTER — Encounter: Payer: BC Managed Care – PPO | Admitting: Family Medicine

## 2022-08-13 ENCOUNTER — Encounter: Payer: BC Managed Care – PPO | Admitting: Family Medicine

## 2022-08-16 ENCOUNTER — Encounter: Payer: Self-pay | Admitting: Emergency Medicine

## 2022-08-16 ENCOUNTER — Ambulatory Visit (INDEPENDENT_AMBULATORY_CARE_PROVIDER_SITE_OTHER): Payer: BC Managed Care – PPO | Admitting: Emergency Medicine

## 2022-08-16 VITALS — BP 130/80 | HR 70 | Temp 98.2°F | Ht 64.25 in | Wt 138.1 lb

## 2022-08-16 DIAGNOSIS — Z7689 Persons encountering health services in other specified circumstances: Secondary | ICD-10-CM

## 2022-08-16 DIAGNOSIS — F4323 Adjustment disorder with mixed anxiety and depressed mood: Secondary | ICD-10-CM | POA: Diagnosis not present

## 2022-08-16 MED ORDER — BUPROPION HCL ER (XL) 150 MG PO TB24: 150.0000 mg | ORAL_TABLET | Freq: Every day | ORAL | 3 refills | Status: DC

## 2022-08-16 NOTE — Patient Instructions (Signed)
Major Depressive Disorder, Adult Major depressive disorder (MDD) is a mental health condition. People with this disorder feel very sad, hopeless, and lose interest in things. Symptoms last most of the day, almost every day, for 2 weeks. MDD can affect: Relationships. Work and school. Things you usually like to do. What are the causes? The cause of MDD is not known. What increases the risk? Having family members with depression. Being female. Family problems. Alcohol or drug misuse. A lot of stress in your life, such as from: Living without basic needs such as food and housing. Being treated poorly because of race, sex, or religion (discrimination). Things that caused you pain as a child, especially if you lost a parent or were abused. Health and mental problems that you have had for a long time. What are the signs or symptoms? The main symptoms of this condition are: Being sad all the time. Being grouchy (irritable) all the time. Not enjoying the things you usually like. Sleeping too much or too little. Eating too much or too little. Feeling tired. Other symptoms include: Gaining or losing weight, without knowing why. Being restless and weak. Feeling hopeless, worthless, or guilty. Trouble thinking or making decisions. Thoughts of hurting yourself or others, or thoughts of ending your life. Spending a lot of time alone. Being unable to do daily tasks. If you have very bad MDD, you may: Believe things that are not true. Hear, see, taste, or feel things that are not there. Have mild depression that lasts for at least 2 years. Feel very sad and hopeless. Have trouble speaking or moving. Feel very sad during some seasons. How is this treated? Talk therapy. This teaches you about thoughts, feelings, and actions and how to change them. This can also help you to talk with others. This can be done with members of your family. Medicines. Lifestyle changes. You may need to: Limit  alcohol use. Stop using drugs, if you use them. Exercise. Get plenty of sleep. Eat healthy. Spend more time outdoors. Brain stimulation. This may be done when symptoms are very bad or have not gotten better. Follow these instructions at home: Alcohol use Do not drink alcohol if: Your health care provider tells you not to drink. You are pregnant, may be pregnant, or are planning to become pregnant. If you drink alcohol: Limit how much you use to: 0-1 drink a day for women. 0-2 drinks a day for men. Know how much alcohol is in your drink. In the [place] one drink equals one 12 oz bottle of beer (355 mL), one 5 oz glass of wine (148 mL), or one 1 oz glass of hard liquor (44 mL). Activity Exercise as told by your doctor. Spend time outdoors. Make time to do the things you enjoy. Find ways to deal with stress. Try to: Meditate. Do deep breathing. Spend time in nature. Keep a journal. Return to your normal activities when your doctor says that it is safe. General instructions  Take over-the-counter and prescription medicines only as told by your doctor. Talk to your doctor about: Alcohol use. It can affect medicines. Any drug use. Eat healthy foods. Get a lot of sleep. Think about joining a support group. Ask your doctor about that. Keep all follow-up visits. Your doctor will need to check on your mood, behavior, and medicines, and change your treatment as needed. Where to find more information: National Alliance on Mental Illness: nami.org National Institute of Mental Health: nimh.nih.gov American Psychiatric Association: psychiatry.org Contact a doctor   if: You feel worse. You get new symptoms. Get help right away if: You hurt yourself on purpose (self-harm). You have thoughts about hurting yourself or others. You see, hear, taste, smell, or feel things that are not there. Get help right away if you feel like you may hurt yourself or others, or have thoughts about taking  your own life. Go to your nearest emergency room or: Call 911. Call the National Suicide Prevention Lifeline at 1-800-273-8255 or 988. This is open 24 hours a day. Text the Crisis Text Line at 741741. This information is not intended to replace advice given to you by your health care provider. Make sure you discuss any questions you have with your health care provider. Document Revised: 11/03/2021 Document Reviewed: 11/03/2021 Elsevier Patient Education  2023 Elsevier Inc.  

## 2022-08-16 NOTE — Assessment & Plan Note (Signed)
Active and affecting quality of life. Known triggers. Needs psychiatry evaluation.  Referral placed today. In the meantime, recommend to start Wellbutrin 150 mg daily as requested by patient.  Other medications in the past generated suicidal thoughts.

## 2022-08-16 NOTE — Progress Notes (Signed)
[name] 62 y.o.   Chief Complaint  Patient presents with   New Patient (Initial Visit)    Patient states she is seeing an allergist and dermatologist for a rash, she is fatigued, patient thinks she is depressed and needs to be on some type of medication.  Referral to psychiatrist     HISTORY OF PRESENT ILLNESS: This is a 62 y.o. female first visit to this office, here to establish care with me. Dealing with anxiety and depression for the past 2 years but worse the last several months due to daughter's situation involving grandkids as well. Requesting medication and psychiatric referral. Complaining of feeling exhausted and stressed with lack of interest.  "I feel awful." No suicidal thoughts at present time.  HPI   Prior to Admission medications   Medication Sig Start Date End Date Taking? Authorizing Provider  ALPRAZolam Duanne Moron) 0.5 MG tablet    Yes [provider]  buPROPion (WELLBUTRIN XL) 150 MG 24 hr tablet Take 1 tablet (150 mg total) by mouth daily. 08/16/22  Yes Judine Arciniega, Ines Bloomer, MD  Clobetasol Prop Emollient Base (CLOBETASOL PROPIONATE E) 0.05 % emollient cream APPLY TO THE AFFECTED AREAS ON THE BODY AFTER THE SHOWER TWICE DAILY 06/17/22  Yes Padgett, Rae Halsted, MD  diphenhydrAMINE (BENADRYL) 25 MG tablet Take 25 mg by mouth every 6 (six) hours as needed.   Yes [provider]  diphenhydramine-acetaminophen (TYLENOL PM) 25-500 MG TABS Take 1 tablet by mouth at bedtime as needed.   Yes [provider]  famotidine (PEPCID) 20 MG tablet Take 20 mg by mouth daily.   Yes [provider]  fluticasone (FLONASE) 50 MCG/ACT nasal spray Place 2 sprays into both nostrils daily.   Yes [provider]  ibuprofen (ADVIL,MOTRIN) 200 MG tablet Take 400 mg by mouth daily as needed for pain (takes about 3 times/week).   Yes [provider]  RYALTRIS [phone] MCG/ACT SUSP 2 sprays each nostril 1-2 times daily as needed. 06/17/22   Yes Padgett, Rae Halsted, MD  Fluocinolone Acetonide Scalp (DERMA-SMOOTHE/FS SCALP) 0.01 % OIL Apply 1 Application topically daily as needed. Patient not taking: Reported on 08/16/2022 06/17/22   Kennith Gain, MD  loratadine (CLARITIN) 10 MG tablet     [provider]    Allergies  Allergen Reactions   Citalopram Hydrobromide Other (See Comments)    Burning mouth   Penicillins Hives    CHILDHOOD ALLERGY   Adhesive [Tape] Rash    And blisters   Latex Rash    Rash and blisters    Patient Active Problem List   Diagnosis Date Noted   Breast cancer, right breast ([facility]) 05/09/2014   HYPERLIPIDEMIA 05/08/2007   HEMORRHOIDS, INTERNAL 05/08/2007   GERD 05/08/2007   Barrett's esophagus 05/08/2007   Irritable bowel syndrome 05/08/2007   BREAST CANCER, HX OF 05/08/2007    Past Medical History:  Diagnosis Date   Anxiety    Arm pain, left    Barrett's esophagus    Breast cancer (HCC) 45   hx   Chest discomfort    GERD (gastroesophageal reflux disease)    Hemorrhoids, internal    Hyperlipidemia    IBS (irritable bowel syndrome)    Palpitations    Personal history of radiation therapy    Schatzki's ring    hx    Past Surgical History:  Procedure Laterality Date   BREAST LUMPECTOMY Right 2007   with sentinel node dissection   COLONOSCOPY  01/2004  internal hemorrhoids   ESOPHAGOGASTRODUODENOSCOPY  01/2004, 02/2007   2006 Short Barrett's and 2008 not identified   HYSTEROSCOPY DIAGNOSTIC  04/06/2011   OVARIAN CYST REMOVAL     PORTACATH PLACEMENT  2007   removed     Social History   Socioeconomic History   Marital status: Single    Spouse name: Not on file   Number of children: 3   Years of education: Not on file   Highest education level: Not on file  Occupational History   Occupation: legal assistant    Comment: [employer]  Tobacco Use   Smoking status: Former    Packs/day: 0.50    Years: 11.00    Total pack years: 5.50    Types:  Cigarettes    Quit date: 07/12/1985    Years since quitting: 37.1   Smokeless tobacco: Never   Tobacco comments:    quit 1987  Vaping Use   Vaping Use: Never used  Substance and Sexual Activity   Alcohol use: Yes    Alcohol/week: 2.0 standard drinks of alcohol    Types: 2 Glasses of wine per week    Comment: every other night    Drug use: No   Sexual activity: Not on file  Other Topics Concern   Not on file  Social History Narrative   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 She sits most of the time. She has three children.   Coffee in AM   Social Determinants of Health   Financial Resource Strain: Not on file  Food Insecurity: Not on file  Transportation Needs: Not on file  Physical Activity: Not on file  Stress: Not on file  Social Connections: Not on file  Intimate Partner Violence: Not on file    Family History  Problem Relation Age of Onset   Esophageal cancer Father 39   Melanoma Maternal Aunt 20   Lung cancer Maternal Grandmother 72       hx of smoking   Breast cancer Maternal Grandmother        dx in her 17s   Cervical cancer Mother 19   Brain cancer Paternal Uncle        dx in his 20s   Colon cancer Maternal Grandfather        early 83s   Ovarian cancer Cousin        dx in her 14s   Diabetes type I Daughter    Coronary artery disease Neg Hx        No absolute premature coronary disease     ROS   Physical Exam   ASSESSMENT & PLAN: A total of 47 minutes was spent with the patient and counseling/coordination of care regarding preparing for this visit, review of available medical records, establishing care with me, comprehensive history and physical examination, diagnosis of active moderate depression/anxiety and need for medication and psychiatric evaluation, stress management, prognosis, documentation and need for follow-up.  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 Known triggers. Needs psychiatry evaluation.  Referral placed today. In the meantime, recommend to start Wellbutrin 150 mg daily as requested by patient.  Other medications in the past generated suicidal thoughts.      Relevant Medications   buPROPion (WELLBUTRIN XL) 150 MG 24 hr tablet   Other Relevant Orders   Ambulatory referral to  Psychiatry   Other Visit Diagnoses     Encounter to establish care          Patient Instructions  Major Depressive Disorder, Adult Major depressive disorder (MDD) is a mental health condition. People with this disorder feel very sad, hopeless, and lose interest in things. Symptoms last most of the day, almost every day, for 2 weeks. MDD can affect: Relationships. Work and school. Things you usually like to do. What are the causes? The cause of MDD is not known. What increases the risk? Having family members with depression. Being female. Family problems. Alcohol or drug misuse. A lot of stress in your life, such as from: Living without basic needs such as food and housing. Being treated poorly because of race, sex, or religion (discrimination). Things that caused you pain as a child, especially if you lost a parent or were abused. Health and mental problems that you have had for a long time. What are the signs or symptoms? The main symptoms of this condition are: Being sad all the time. Being grouchy (irritable) all the time. Not enjoying the things you usually like. Sleeping too much or too little. Eating too much or too little. Feeling tired. Other symptoms include: Gaining or losing weight, without knowing why. Being restless and weak. Feeling hopeless, worthless, or guilty. Trouble thinking or making decisions. Thoughts of hurting yourself or others, or thoughts of ending your life. Spending a lot of time alone. Being unable to do daily tasks. If you have very bad MDD, you  may: Believe things that are not true. Hear, see, taste, or feel things that are not there. Have mild depression that lasts for at least 2 years. Feel very sad and hopeless. Have trouble speaking or moving. Feel very sad during some seasons. How is this treated? Talk therapy. This teaches you about thoughts, feelings, and actions and how to change them. This can also help you to talk with others. This can be done with members of your family. Medicines. Lifestyle changes. You may need to: Limit alcohol use. Stop using drugs, if you use them. Exercise. Get plenty of sleep. Eat healthy. Spend more time outdoors. Brain stimulation. This may be done when symptoms are very bad or have not gotten better. Follow these instructions at home: Alcohol use Do not drink alcohol if: Your health care provider tells you not to drink. You are pregnant, may be pregnant, or are planning to become pregnant. If you drink alcohol: Limit how much you use to: 0-1 drink a day for women. 0-2 drinks a day for men. Know how much alcohol is in your drink. In the [place] one drink equals one 12 oz bottle of beer (355 mL), one 5 oz glass of wine (148 mL), or one 1 oz glass of hard liquor (44 mL). Activity Exercise as told by your doctor. Spend time outdoors. Make time to do the things you enjoy. Find ways to deal with stress. Try to: Meditate. Do deep breathing. Spend time in nature. Keep a journal. Return to your normal activities when your doctor says that it is safe. General instructions  Take over-the-counter and prescription medicines only as told by your doctor. Talk to your doctor about: Alcohol use. It can affect medicines. Any drug use. Eat healthy foods. Get a lot of sleep. Think about joining a support group. Ask your doctor about that. Keep all follow-up visits. Your doctor will need to check on your mood, behavior, and medicines, and change your treatment  as needed. Where to find more  information: [employer] on Mental Illness: nami.[employer] of Mental Health: https://www.frey.org/ American Psychiatric Association: psychiatry.org Contact a doctor if: You feel worse. You get new symptoms. Get help right away if: You hurt yourself on purpose (self-harm). You have thoughts about hurting yourself or others. You see, hear, taste, smell, or feel things that are not there. Get help right away if you feel like you may hurt yourself or others, or have thoughts about taking your own life. Go to your nearest emergency room or: Call 911. Call the [facility] at [phone] or 988. This is open 24 hours a day. Text the Crisis Text Line at (939)631-0340. This information is not intended to replace advice given to you by your health care provider. Make sure you discuss any questions you have with your health care provider. Document Revised: 11/03/2021 Document Reviewed: 11/03/2021 Elsevier Patient Education  Franks Field, MD [facility]

## 2022-09-14 ENCOUNTER — Telehealth (INDEPENDENT_AMBULATORY_CARE_PROVIDER_SITE_OTHER): Payer: BC Managed Care – PPO | Admitting: Family Medicine

## 2022-09-14 ENCOUNTER — Encounter: Payer: Self-pay | Admitting: Family Medicine

## 2022-09-14 DIAGNOSIS — R051 Acute cough: Secondary | ICD-10-CM | POA: Diagnosis not present

## 2022-09-14 DIAGNOSIS — J0100 Acute maxillary sinusitis, unspecified: Secondary | ICD-10-CM

## 2022-09-14 MED ORDER — ALBUTEROL SULFATE HFA 108 (90 BASE) MCG/ACT IN AERS: 2.0000 | INHALATION_SPRAY | Freq: Four times a day (QID) | RESPIRATORY_TRACT | 0 refills | Status: AC | PRN

## 2022-09-14 MED ORDER — AZITHROMYCIN 250 MG PO TABS: ORAL_TABLET | ORAL | 0 refills | Status: AC

## 2022-09-14 MED ORDER — BENZONATATE 100 MG PO CAPS: ORAL_CAPSULE | ORAL | 0 refills | Status: DC

## 2022-09-14 NOTE — Patient Instructions (Signed)
-  I sent the medication(s) we discussed to your pharmacy: Meds ordered this encounter  Medications   azithromycin (ZITHROMAX) 250 MG tablet    Sig: Take 2 tablets on day 1, then 1 tablet daily on days 2 through 5    Dispense:  6 tablet    Refill:  0   benzonatate (TESSALON PERLES) 100 MG capsule    Sig: 1-2 capsules up to twice daily as needed for cough.    Dispense:  30 capsule    Refill:  0   albuterol (PROAIR HFA) 108 (90 Base) MCG/ACT inhaler    Sig: Inhale 2 puffs into the lungs every 6 (six) hours as needed for wheezing or shortness of breath.    Dispense:  1 each    Refill:  0     I hope you are feeling better soon!  Seek in person care promptly if your symptoms worsen, new concerns arise or you are not improving with treatment.  It was nice to meet you today. I help [facility] out with telemedicine visits on Tuesdays and Thursdays and am happy to help if you need a virtual follow up visit on those days. Otherwise, if you have any concerns or questions following this visit please schedule a follow up visit with your Primary Care office or seek care at a local urgent care clinic to avoid delays in care. If you are having severe or life threatening symptoms please call 911 and/or go to the nearest emergency room.

## 2022-09-14 NOTE — Progress Notes (Signed)
Virtual Visit via Video Note  I connected with [name]  on 09/14/22 at 11:20 AM EST by a video enabled telemedicine application and verified that I am speaking with the correct person using two identifiers.  Location patient: [place] Location provider:work or home office Persons participating in the virtual visit: patient, provider  I discussed the limitations and requested verbal permission for telemedicine visit. The patient expressed understanding and agreed to proceed.   HPI:  Acute telemedicine visit for : -Onset: about 2 weeks ago -Symptoms include: started out with a cold a few weeks ago when grandkids were sick, has not gone away, now with thick green sinus drainage, maxillary sinus discomfort on the R, cough, wheezing at times, cough, low grade temp around 99-100 -Denies: fevers, CP, SOB, NVD -Has tried: Tylenol, advil, OTC musinex for cough -Pertinent past medical history: see below, covid in the past and reports worse with this then when had covid -Pertinent medication allergies: Allergies  Allergen Reactions   Citalopram Hydrobromide Other (See Comments)    Burning mouth   Penicillins Hives    CHILDHOOD ALLERGY   Adhesive [Tape] Rash    And blisters   Latex Rash    Rash and blisters   -COVID-19 vaccine status:  Immunization History  Administered Date(s) Administered   Tdap 01/13/2022     ROS: See pertinent positives and negatives per HPI.  Past Medical History:  Diagnosis Date   Anxiety    Arm pain, left    Barrett's esophagus    Breast cancer (HCC) 45   hx   Chest discomfort    GERD (gastroesophageal reflux disease)    Hemorrhoids, internal    Hyperlipidemia    IBS (irritable bowel syndrome)    Palpitations    Personal history of radiation therapy    Schatzki's ring    hx    Past Surgical History:  Procedure Laterality Date   BREAST LUMPECTOMY Right 2007   with sentinel node dissection   COLONOSCOPY  01/2004   internal hemorrhoids    ESOPHAGOGASTRODUODENOSCOPY  01/2004, 02/2007   2006 Short Barrett's and 2008 not identified   HYSTEROSCOPY DIAGNOSTIC  04/06/2011   OVARIAN CYST REMOVAL     PORTACATH PLACEMENT  2007   removed      Current Outpatient Medications:    albuterol (PROAIR HFA) 108 (90 Base) MCG/ACT inhaler, Inhale 2 puffs into the lungs every 6 (six) hours as needed for wheezing or shortness of breath., Disp: 1 each, Rfl: 0   ALPRAZolam (XANAX) 0.5 MG tablet, , Disp: , Rfl:    azithromycin (ZITHROMAX) 250 MG tablet, Take 2 tablets on day 1, then 1 tablet daily on days 2 through 5, Disp: 6 tablet, Rfl: 0   benzonatate (TESSALON PERLES) 100 MG capsule, 1-2 capsules up to twice daily as needed for cough., Disp: 30 capsule, Rfl: 0   Clobetasol Prop Emollient Base (CLOBETASOL PROPIONATE E) 0.05 % emollient cream, APPLY TO THE AFFECTED AREAS ON THE BODY AFTER THE SHOWER TWICE DAILY, Disp: 60 g, Rfl: 3   diphenhydrAMINE (BENADRYL) 25 MG tablet, Take 25 mg by mouth every 6 (six) hours as needed., Disp: , Rfl:    famotidine (PEPCID) 20 MG tablet, Take 20 mg by mouth daily., Disp: , Rfl:    ibuprofen (ADVIL,MOTRIN) 200 MG tablet, Take 400 mg by mouth daily as needed for pain (takes about 3 times/week)., Disp: , Rfl:    loratadine (CLARITIN) 10 MG tablet, , Disp: , Rfl:    buPROPion (WELLBUTRIN XL)  150 MG 24 hr tablet, Take 1 tablet (150 mg total) by mouth daily. (Patient not taking: Reported on 09/14/2022), Disp: 90 tablet, Rfl: 3   diphenhydramine-acetaminophen (TYLENOL PM) 25-500 MG TABS, Take 1 tablet by mouth at bedtime as needed. (Patient not taking: Reported on 09/14/2022), Disp: , Rfl:    Fluocinolone Acetonide Scalp (DERMA-SMOOTHE/FS SCALP) 0.01 % OIL, Apply 1 Application topically daily as needed. (Patient not taking: Reported on 08/16/2022), Disp: 118.28 mL, Rfl: 3   fluticasone (FLONASE) 50 MCG/ACT nasal spray, Place 2 sprays into both nostrils daily. (Patient not taking: Reported on 09/14/2022), Disp: , Rfl:    RYALTRIS  665-25 MCG/ACT SUSP, 2 sprays each nostril 1-2 times daily as needed. (Patient not taking: Reported on 09/14/2022), Disp: 29 g, Rfl: 5  EXAM:  VITALS per patient if applicable:  GENERAL: alert, oriented, appears well and in no acute distress  HEENT: atraumatic, conjunttiva clear, no obvious abnormalities on inspection of external nose and ears  NECK: normal movements of the head and neck  LUNGS: on inspection no signs of respiratory distress, breathing rate appears normal, no obvious gross SOB, gasping or wheezing  CV: no obvious cyanosis  MS: moves all visible extremities without noticeable abnormality  PSYCH/NEURO: pleasant and cooperative, no obvious depression or anxiety, speech and thought processing grossly intact  ASSESSMENT AND PLAN:  Discussed the following assessment and plan:  Acute maxillary sinusitis, recurrence not specified  Acute cough  -we discussed possible serious and likely etiologies, options for evaluation and workup, limitations of telemedicine visit vs in person visit, treatment, treatment risks and precautions. Pt is agreeable to treatment via telemedicine at this moment. Query 2ndary bacterial sinusitis, possible bronchitis component vs other. Discussed options and she would like to try empiric treatment with azithromycin as she is sensitive to medications and this has worked well for her in the past. Also sent tessalon for cough and alb in case further wheezing - discussed proper use and demonstrated how to use inhaler. . Advised to seek prompt virtual visit or in person care if worsening, new symptoms arise, or if is not improving with treatment as expected per our conversation of expected course. Discussed options for follow up care. Did let this patient know that I do telemedicine on Tuesdays and Thursdays for [facility] and those are the days I am logged into the system. Advised to schedule follow up visit with PCP, [facility] virtual visits or [facility] if any  further questions or concerns to avoid delays in care.   I discussed the assessment and treatment plan with the patient. The patient was provided an opportunity to ask questions and all were answered. The patient agreed with the plan and demonstrated an understanding of the instructions.     Lucretia Kern, DO

## 2022-11-01 ENCOUNTER — Ambulatory Visit: Payer: BC Managed Care – PPO | Admitting: Emergency Medicine

## 2022-12-13 ENCOUNTER — Other Ambulatory Visit: Payer: Self-pay | Admitting: Family Medicine

## 2022-12-14 NOTE — Telephone Encounter (Signed)
Attempted to reach pt. Left a voicemail to contact us back.  

## 2022-12-15 NOTE — Telephone Encounter (Signed)
Spoke to pt.   Pt states she has problem with breathing, wheezing. Been using albuterol inhaler. Was helping in the past, noticed it not seem to help as it used to. Pt states she has some refill left of albuterol inhaler, wanted to get more refill.   Pt continues she see allergist and think maybe she should go to her allergist. Inform pt, she can go ahead and reach out to her allergist.   Pt states she will contact her allergist today to see if she can get in and get in touch with her pcp.

## 2022-12-24 ENCOUNTER — Other Ambulatory Visit: Payer: Self-pay

## 2022-12-24 ENCOUNTER — Ambulatory Visit (INDEPENDENT_AMBULATORY_CARE_PROVIDER_SITE_OTHER): Payer: BC Managed Care – PPO | Admitting: Allergy

## 2022-12-24 ENCOUNTER — Encounter: Payer: Self-pay | Admitting: Allergy

## 2022-12-24 VITALS — BP 140/98 | HR 84 | Temp 97.6°F | Ht 64.25 in | Wt 141.8 lb

## 2022-12-24 DIAGNOSIS — J4520 Mild intermittent asthma, uncomplicated: Secondary | ICD-10-CM | POA: Diagnosis not present

## 2022-12-24 DIAGNOSIS — J301 Allergic rhinitis due to pollen: Secondary | ICD-10-CM | POA: Diagnosis not present

## 2022-12-24 DIAGNOSIS — R21 Rash and other nonspecific skin eruption: Secondary | ICD-10-CM

## 2022-12-24 MED ORDER — AIRSUPRA 90-80 MCG/ACT IN AERO: 2.0000 | INHALATION_SPRAY | RESPIRATORY_TRACT | 1 refills | Status: DC | PRN

## 2022-12-24 MED ORDER — RYALTRIS 665-25 MCG/ACT NA SUSP: 2.0000 | Freq: Two times a day (BID) | NASAL | 5 refills | Status: AC | PRN

## 2022-12-24 MED ORDER — CLOBETASOL PROPIONATE E 0.05 % EX CREA: TOPICAL_CREAM | CUTANEOUS | 3 refills | Status: AC

## 2022-12-24 NOTE — Patient Instructions (Addendum)
-   continue avoidance measures for grass and tree pollens.   - stop Clartin-D and would use antihistamine either Allegra or Xyzal daily.  These are usually more effective antihistamines.  - for nasal congestion/drainage symptoms use Ryaltris nasal spray 2 sprays each nostril 2 times a day at this time until symptoms improve  - use Airsupra 2 puffs every 4 hours as needed for cough/wheeze/shortness of breath/chest tightness.  May use 15-20 minutes prior to activity.   Monitor frequency of use.   Paulene Floor replaces plain Albuterol use.  Paulene Floor will provide longer lasting benefit over plain Albuterol.   - continue as needed use of clobetasol twice a day as needed to areas of the body that become dry, itchy, irritated, red, patchy, scaly, flaky, rough, bumpy etc.   - if rash returns or new rash develops take pictures.   If needed in future patch testing can be performed.  Patch testing is the test of choice to evaluate for contact dermatitis.  Recommend performing patch testing with the TRUE test patch panels.  Patches are best placed on a Monday with return to office on Wednesday and Friday of same week for readings.  Once patches are in place to do not get them wet.  You can take antihistamines while patches are in place.   True Test looks for the following sensitivities:      Follow-up in 4-6 months or sooner if needed

## 2022-12-24 NOTE — Progress Notes (Signed)
Follow-up Note  RE: [name] MRN: [id number] DOB: 11-Jan-1961 Date of Office Visit: 12/24/2022   History of present illness: [name] is a 62 y.o. female presenting today for follow-up of dermatitis and allergic rhinitis.  She was last seen in the office on 06/17/2022 by myself.  She states after this visit the rash she was having did resolve.  She did see a dermatologist however her she did not get any further information as to what was causing the rash and or what to do if it does return.  She did use the clobetasol ointment that I prescribed on the rash on the body and states it did help.  When she applied it the rash did resolve.  She did also use the Derma-Smoothe on the scalp and she did the instructions as directed and states that it did have a burning sensation so she washed it out and did not use this again.  The scalp dermatitis seem to improve and resolve as well. She has noted however that she has been having some dizziness, feeling that she is underwater, ringing in the ears, fullness feeling of the ears as well as nasal drainage and voice changes.  She is also been noting wheezing that can happen also at night.  Because of all of this she was prescribed an albuterol inhaler by her PCP.  The albuterol inhaler does help.  She was not sure if the voice changes and mucus in the throat was related to reflux that she added Pepcid to her regimen. At the last visit I did give her some Ryaltris, but she states she did not need to use and she still has the bottle at home but she has not tried this with her current symptoms.  She states she does have a dog that does go outside and does come back in to the home.  On her allergy testing from last visit she was positive to grass and tree pollens.  Review of systems: Review of Systems  Constitutional: Negative.   HENT:         See HPI  Eyes: Negative.   Respiratory:         See HPI  Cardiovascular: Negative.   Gastrointestinal:  Negative.   Musculoskeletal: Negative.   Skin: Negative.   Allergic/Immunologic: Negative.   Neurological: Negative.      All other systems negative unless noted above in HPI  Past medical/social/surgical/family history have been reviewed and are unchanged unless specifically indicated below.  No changes  Medication List: Current Outpatient Medications  Medication Sig Dispense Refill   albuterol (PROAIR HFA) 108 (90 Base) MCG/ACT inhaler Inhale 2 puffs into the lungs every 6 (six) hours as needed for wheezing or shortness of breath. 1 each 0   Albuterol-Budesonide (AIRSUPRA) 90-80 MCG/ACT AERO Inhale 2 puffs into the lungs every 4 (four) hours as needed. 10.7 g 1   ALPRAZolam (XANAX) 0.5 MG tablet      diphenhydrAMINE (BENADRYL) 25 MG tablet Take 25 mg by mouth every 6 (six) hours as needed.     famotidine (PEPCID) 20 MG tablet Take 20 mg by mouth daily.     ibuprofen (ADVIL,MOTRIN) 200 MG tablet Take 400 mg by mouth daily as needed for pain (takes about 3 times/week).     loratadine (CLARITIN) 10 MG tablet      Olopatadine-Mometasone (RYALTRIS) 665-25 MCG/ACT SUSP Place 2 sprays into the nose 2 (two) times daily as needed. 29 g 5  Clobetasol Prop Emollient Base (CLOBETASOL PROPIONATE E) 0.05 % emollient cream APPLY TO THE AFFECTED AREAS ON THE BODY AFTER THE SHOWER TWICE DAILY 60 g 3   diphenhydramine-acetaminophen (TYLENOL PM) 25-500 MG TABS Take 1 tablet by mouth at bedtime as needed. (Patient not taking: Reported on 09/14/2022)     Fluocinolone Acetonide Scalp (DERMA-SMOOTHE/FS SCALP) 0.01 % OIL Apply 1 Application topically daily as needed. (Patient not taking: Reported on 08/16/2022) 118.28 mL 3   fluticasone (FLONASE) 50 MCG/ACT nasal spray Place 2 sprays into both nostrils daily. (Patient not taking: Reported on 09/14/2022)     RYALTRIS 665-25 MCG/ACT SUSP 2 sprays each nostril 1-2 times daily as needed. (Patient not taking: Reported on 09/14/2022) 29 g 5   No current  facility-administered medications for this visit.     Known medication allergies: Allergies  Allergen Reactions   Citalopram Hydrobromide Other (See Comments)    Burning mouth   Penicillins Hives    CHILDHOOD ALLERGY   Adhesive [Tape] Rash    And blisters   Latex Rash    Rash and blisters     Physical examination: Blood pressure (!) 140/98, pulse 84, temperature 97.6 F (36.4 C), height 5' 4.25" (1.632 m), weight 141 lb 12.8 oz (64.3 kg), last menstrual period 03/15/2011, SpO2 100 %.  General: Alert, interactive, in no acute distress. HEENT: PERRLA, TMs pearly gray, turbinates mildly edematous with clear discharge, post-pharynx non erythematous. Neck: Supple without lymphadenopathy. Lungs: Very mild end expiratory wheeze of the left upper lobe left lung exam is clear . {no increased work of breathing. CV: Normal S1, S2 without murmurs. Abdomen: Nondistended, nontender. Skin: Warm and dry, without lesions or rashes. Extremities:  No clubbing, cyanosis or edema. Neuro:   Grossly intact.  Diagnositics/Labs:  Spirometry: FEV1: 2.75L 110%, FVC: 3.45L 109%, ratio consistent with nonobstructive pattern  Assessment and plan: Dermatitis Allergic rhinitis with postnasal drip Reactive airway secondary to allergens   - continue avoidance measures for grass and tree pollens.   - stop Clartin-D and would use antihistamine either Allegra or Xyzal daily.  These are usually more effective antihistamines.  - for nasal congestion/drainage symptoms use Ryaltris nasal spray 2 sprays each nostril 2 times a day at this time until symptoms improve  - use Airsupra 2 puffs every 4 hours as needed for cough/wheeze/shortness of breath/chest tightness.  May use 15-20 minutes prior to activity.   Monitor frequency of use.   [name] replaces plain Albuterol use.  [name] will provide longer lasting benefit over plain Albuterol.   - continue as needed use of clobetasol twice a day as needed to areas  of the body that become dry, itchy, irritated, red, patchy, scaly, flaky, rough, bumpy etc.   - if rash returns or new rash develops take pictures.   If needed in future patch testing can be performed.  Patch testing is the test of choice to evaluate for contact dermatitis.  Recommend performing patch testing with the TRUE test patch panels.  Patches are best placed on a Monday with return to office on Wednesday and Friday of same week for readings.  Once patches are in place to do not get them wet.  You can take antihistamines while patches are in place.    Follow-up in 4-6 months or sooner if needed   I appreciate the opportunity to take part in [name]'s care. Please do not hesitate to contact me with questions.  Sincerely,   Margo Aye, MD Allergy/Immunology [facility]

## 2023-04-20 ENCOUNTER — Ambulatory Visit: Payer: BC Managed Care – PPO | Admitting: Gastroenterology

## 2023-04-20 ENCOUNTER — Other Ambulatory Visit: Payer: BC Managed Care – PPO

## 2023-04-20 ENCOUNTER — Ambulatory Visit: Payer: BC Managed Care – PPO | Admitting: Physician Assistant

## 2023-04-20 ENCOUNTER — Encounter: Payer: Self-pay | Admitting: Gastroenterology

## 2023-04-20 VITALS — BP 124/82 | HR 74 | Ht 64.75 in | Wt 143.0 lb

## 2023-04-20 DIAGNOSIS — Z1231 Encounter for screening mammogram for malignant neoplasm of breast: Secondary | ICD-10-CM | POA: Diagnosis not present

## 2023-04-20 DIAGNOSIS — Z124 Encounter for screening for malignant neoplasm of cervix: Secondary | ICD-10-CM | POA: Diagnosis not present

## 2023-04-20 DIAGNOSIS — Z01419 Encounter for gynecological examination (general) (routine) without abnormal findings: Secondary | ICD-10-CM | POA: Diagnosis not present

## 2023-04-20 DIAGNOSIS — Z13228 Encounter for screening for other metabolic disorders: Secondary | ICD-10-CM | POA: Diagnosis not present

## 2023-04-20 DIAGNOSIS — R1013 Epigastric pain: Secondary | ICD-10-CM

## 2023-04-20 DIAGNOSIS — K219 Gastro-esophageal reflux disease without esophagitis: Secondary | ICD-10-CM | POA: Diagnosis not present

## 2023-04-20 DIAGNOSIS — K2270 Barrett's esophagus without dysplasia: Secondary | ICD-10-CM

## 2023-04-20 DIAGNOSIS — Z1151 Encounter for screening for human papillomavirus (HPV): Secondary | ICD-10-CM | POA: Diagnosis not present

## 2023-04-20 DIAGNOSIS — Z13220 Encounter for screening for lipoid disorders: Secondary | ICD-10-CM | POA: Diagnosis not present

## 2023-04-20 DIAGNOSIS — R7989 Other specified abnormal findings of blood chemistry: Secondary | ICD-10-CM

## 2023-04-20 DIAGNOSIS — Z6824 Body mass index (BMI) 24.0-24.9, adult: Secondary | ICD-10-CM | POA: Diagnosis not present

## 2023-04-20 DIAGNOSIS — Z1329 Encounter for screening for other suspected endocrine disorder: Secondary | ICD-10-CM | POA: Diagnosis not present

## 2023-04-20 DIAGNOSIS — Z131 Encounter for screening for diabetes mellitus: Secondary | ICD-10-CM | POA: Diagnosis not present

## 2023-04-20 NOTE — Progress Notes (Signed)
Chief Complaint: GERD. Epigastric pain Primary GI MD: Dr. Leone Payor  HPI: 63 year old female, history of Barrett's esophagus (2005), family history of esophageal cancer in her father, breast cancer s/p lumpectomy 2007 treated with chemo and radiation, presents for evaluation of epigastric pain and GERD.   Discussed the use of AI scribe software for clinical note transcription with the patient, who gave verbal consent to proceed.  History of Present Illness   The patient, with a history of Barrett's esophagus, presents for a follow-up visit to schedule an endoscopy. She saw Jill Side last year and was set to get an EGD but had issues with hives and was seeing an allergist and then was having deductible issues with insurance.  They report a four-week history of intermittent epigastric abdominal pain, described as an ache 'like someone's punched me in the stomach.' The pain is not constant, but occurs a couple of times a day and has woken them up at night. Eating and drinking wine sometimes alleviate the pain, but Tums and Pepcid have not been effective. They also report a hoarse voice for the past year. She states she had a similar pain when her dad died and she took xanax which provided relief. Has not tried that with this pain. She notes her body often feels physical symptoms after dealing with stress. No significant stressors at this time out of the "normal stress."  The patient has a history of stress and anxiety, and previously experienced similar symptoms after their father's death, which were thought to be stress-related. They have been trying to manage their stress through yoga and breathing exercises, and have reduced their intake of Advil from 3-4 tablets daily to about twice a week over the past four months.  Recently, they experienced an episode of severe pain, during which they had difficulty swallowing and felt a burning sensation in their chest and stomach. They considered going to the  ER, but the pain eventually subsided after using a heating pad, taking Tums, and drinking ginger tea.  Denies weight loss, melena, hematochezia.   PREVIOUS GI WORKUP   EGD 04/13/2017: - LA Grade A reflux esophagitis. Biopsied. 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 Biopsied. - The examination was otherwise normal.   Colonoscopy 04/13/2017: - The perianal and digital rectal examinations were normal. - The colon (entire examined portion) appeared normal. - No additional abnormalities were found on retroflexion. - Anal papilla(e) were hypertrophied. - 10 year colonoscopy    Diagnosis 1. Surgical [P], distal esophagus - REFLUX CHANGES. - NO INTESTINAL METAPLASIA, DYSPLASIA, OR MALIGNANCY. 2. Surgical [P], mid esophagus and distal esophagus - BENIGN SQUAMOUS MUCOSA. - NO INCREASE IN INTRAEPITHELIAL EOSINOPHILS. - NO INTESTINAL METAPLASIA, DYSPLASIA, OR MALIGNANCY  Past Medical History:  Diagnosis Date   Anxiety    Arm pain, left    Barrett's esophagus    Breast cancer (HCC) 45   hx   Chest discomfort    GERD (gastroesophageal reflux disease)    Hemorrhoids, internal    Hyperlipidemia    IBS (irritable bowel syndrome)    Palpitations    Personal history of radiation therapy    Schatzki's ring    hx    Past Surgical History:  Procedure Laterality Date   BREAST LUMPECTOMY Right 2007   with sentinel node dissection   COLONOSCOPY  01/2004   internal hemorrhoids   ESOPHAGOGASTRODUODENOSCOPY  01/2004, 02/2007   2006 Short Barrett's and 2008 not identified   HYSTEROSCOPY DIAGNOSTIC  04/06/2011   OVARIAN CYST REMOVAL  PORTACATH PLACEMENT  2007   removed     Current Outpatient Medications  Medication Sig Dispense Refill   albuterol (PROAIR HFA) 108 (90 Base) MCG/ACT inhaler Inhale 2 puffs into the lungs every 6 (six) hours as needed for wheezing or shortness of breath. 1 each 0   Albuterol-Budesonide (AIRSUPRA) 90-80 MCG/ACT AERO Inhale 2  puffs into the lungs every 4 (four) hours as needed. 10.7 g 1   ALPRAZolam (XANAX) 0.5 MG tablet      Clobetasol Prop Emollient Base (CLOBETASOL PROPIONATE E) 0.05 % emollient cream APPLY TO THE AFFECTED AREAS ON THE BODY AFTER THE SHOWER TWICE DAILY 60 g 3   famotidine (PEPCID) 20 MG tablet Take 20 mg by mouth daily.     ibuprofen (ADVIL,MOTRIN) 200 MG tablet Take 400 mg by mouth daily as needed for pain (takes about 3 times/week).     loratadine (CLARITIN) 10 MG tablet      Olopatadine-Mometasone (RYALTRIS) 665-25 MCG/ACT SUSP Place 2 sprays into the nose 2 (two) times daily as needed. 29 g 5   RYALTRIS 665-25 MCG/ACT SUSP 2 sprays each nostril 1-2 times daily as needed. 29 g 5   No current facility-administered medications for this visit.    Allergies as of 04/20/2023 - Review Complete 04/20/2023  Allergen Reaction Noted   Citalopram hydrobromide Other (See Comments) 05/14/2013   Penicillins Hives    Adhesive [tape] Rash 01/08/2013   Latex Rash 04/07/2011    Family History  Problem Relation Age of Onset   Esophageal cancer Father 26   Melanoma Maternal Aunt 60   Lung cancer Maternal Grandmother 45       hx of smoking   Breast cancer Maternal Grandmother        dx in her 67s   Cervical cancer Mother 41   Brain cancer Paternal Uncle        dx in his 31s   Colon cancer Maternal Grandfather        early 17s   Ovarian cancer Cousin        dx in her 4s   Diabetes type I Daughter    Coronary artery disease Neg Hx        No absolute premature coronary disease    Social History   Socioeconomic History   Marital status: Single    Spouse name: Not on file   Number of children: 3   Years of education: Not on file   Highest education level: Not on file  Occupational History   Occupation: Librarian, academic    Comment: [employer]  Tobacco Use   Smoking status: Former    Current packs/day: 0.00    Average packs/day: 0.5 packs/day for 11.0 years (5.5 ttl pk-yrs)    Types:  Cigarettes    Start date: 07/12/1974    Quit date: 07/12/1985    Years since quitting: 37.7   Smokeless tobacco: Never   Tobacco comments:    quit 1987  Vaping Use   Vaping status: Never Used  Substance and Sexual Activity   Alcohol use: Yes    Alcohol/week: 2.0 standard drinks of alcohol    Types: 2 Glasses of wine per week    Comment: every other night    Drug use: No   Sexual activity: Not on file  Other Topics Concern   Not on file  Social History Narrative   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 She sits most of the time. She has three  children.   Coffee in AM   Social Determinants of Health   Financial Resource Strain: Not on file  Food Insecurity: Not on file  Transportation Needs: Not on file  Physical Activity: Not on file  Stress: Not on file  Social Connections: Not on file  Intimate Partner Violence: Not on file    Review of Systems:    Constitutional: No weight loss, fever, chills, weakness or fatigue HEENT: Eyes: No change in vision               Ears, Nose, Throat:  No change in hearing or congestion Skin: No rash or itching Cardiovascular: No chest pain, chest pressure or palpitations   Respiratory: No SOB or cough Gastrointestinal: See HPI and otherwise negative Genitourinary: No dysuria or change in urinary frequency Neurological: No headache, dizziness or syncope Musculoskeletal: No new muscle or joint pain Hematologic: No bleeding or bruising Psychiatric: No history of depression or anxiety    Physical Exam:  Vital signs: BP 124/82   Pulse 74   Ht 5' 4.75" (1.645 m)   Wt 64.9 kg   LMP 03/15/2011   BMI 23.98 kg/m   Constitutional: NAD, Well developed, Well nourished, alert and cooperative Head:  Normocephalic and atraumatic. Eyes:   PEERL, EOMI. No icterus. Conjunctiva pink. Respiratory: Respirations even and unlabored. Lungs clear to auscultation bilaterally.   No wheezes, crackles, or rhonchi.  Cardiovascular:  Regular  rate and rhythm. No peripheral edema, cyanosis or pallor.  Gastrointestinal:  Soft, nondistended, nontender. No rebound or guarding. Normal bowel sounds. No appreciable masses or hepatomegaly. Rectal:  Not performed.  Msk:  Symmetrical without gross deformities. Without edema, no deformity or joint abnormality.  Neurologic:  Alert and  oriented x4;  grossly normal neurologically.  Skin:   Dry and intact without significant lesions or rashes. Psychiatric: Oriented to person, place and time. Demonstrates good judgement and reason without abnormal affect or behaviors.  Physical Exam           RELEVANT LABS AND IMAGING: CBC    Component Value Date/Time   WBC 4.6 08/12/2021 0754   RBC 4.68 08/12/2021 0754   HGB 12.9 08/12/2021 0754   HGB 12.5 05/02/2014 0829   HCT 40.1 08/12/2021 0754   HCT 39.2 05/02/2014 0829   PLT 254 08/12/2021 0754   PLT 313 05/02/2014 0829   MCV 85.7 08/12/2021 0754   MCV 80.6 05/02/2014 0829   MCH 27.6 08/12/2021 0754   MCHC 32.2 08/12/2021 0754   RDW 12.8 08/12/2021 0754   RDW 13.1 05/02/2014 0829   LYMPHSABS 971 08/12/2021 0754   LYMPHSABS 1.4 05/02/2014 0829   MONOABS 0.4 05/02/2014 0829   EOSABS 791 (H) 08/12/2021 0754   EOSABS 0.4 05/02/2014 0829   BASOSABS 129 08/12/2021 0754   BASOSABS 0.1 05/02/2014 0829    CMP     Component Value Date/Time   NA 138 07/21/2021 0804   NA 140 05/02/2014 0829   K 3.8 07/21/2021 0804   K 4.5 05/02/2014 0829   CL 102 07/21/2021 0804   CL 104 07/20/2012 0809   CO2 31 07/21/2021 0804   CO2 27 05/02/2014 0829   GLUCOSE 91 07/21/2021 0804   GLUCOSE 91 05/02/2014 0829   GLUCOSE 92 07/20/2012 0809   BUN 10 07/21/2021 0804   BUN 12.9 05/02/2014 0829   CREATININE 0.78 07/21/2021 0804   CREATININE 0.8 05/02/2014 0829   CALCIUM 8.8 07/21/2021 0804   CALCIUM 9.9 05/02/2014 0829   PROT 6.1 07/21/2021  0804   PROT 7.2 05/02/2014 0829   ALBUMIN 4.0 05/02/2014 0829   AST 44 (H) 07/21/2021 0804   AST 19 05/02/2014  0829   ALT 32 (H) 07/21/2021 0804   ALT 16 05/02/2014 0829   ALKPHOS 53 05/02/2014 0829   BILITOT 1.0 07/21/2021 0804   BILITOT 0.45 05/02/2014 0829   GFRNONAA >60 02/14/2017 1258   GFRAA >60 02/14/2017 1258     Assessment/Plan:      Abdominal Pain Epigastric pain for 4 weeks, intermittent, sometimes relieved by eating. No weight loss, black or bloody stools. History of Barrett's esophagus. Recent history of high ibuprofen use (3 to 4 tablets a day) now only taking 1 to 2/week.  Previous pain similar relieved with Xanax and due to stress.  Concern for gastritis, esophagitis, PUD.  Also could be related to anxiety as well. On famotidine 20mg  prn. -EGD for further evaluation, pending results can start PPI after procedure - CBC, CMP, lipase - Avoid NSAIDs - I thoroughly discussed the procedure with the patient (at bedside) to include nature of the procedure, alternatives, benefits, and risks (including but not limited to bleeding, infection, perforation, anesthesia/cardiac pulmonary complications).  Patient verbalized understanding and gave verbal consent to proceed with procedure.  - follow up per procedure  Hoarseness Chronic hoarseness for about a year. Possible silent reflux. -Assess after endoscopy results.  Barrett's Esophagus Last endoscopy in 2018. Delayed follow-up due to other medical issues and financial constraints. -Schedule endoscopy with Dr. Leone Payor.      Lara [facility] 04/20/2023, 2:05 PM  Cc: Deatra James, MD

## 2023-04-20 NOTE — Patient Instructions (Addendum)
Your provider has requested that you go to the basement level for lab work before leaving today. Press "B" on the elevator. The lab is located at the first door on the left as you exit the elevator.   You have been scheduled for an endoscopy. Please follow written instructions given to you at your visit today.  If you use inhalers (even only as needed), please bring them with you on the day of your procedure.  If you take any of the following medications, they will need to be adjusted prior to your procedure:   DO NOT TAKE 7 DAYS PRIOR TO TEST- Trulicity (dulaglutide) Ozempic, Wegovy (semaglutide) Mounjaro (tirzepatide) Bydureon Bcise (exanatide extended release)  DO NOT TAKE 1 DAY PRIOR TO YOUR TEST Rybelsus (semaglutide) Adlyxin (lixisenatide) Victoza (liraglutide) Byetta (exanatide) ___________________________________________________________________________   _______________________________________________________  If your blood pressure at your visit was 140/90 or greater, please contact your primary care physician to follow up on this.  _______________________________________________________  If you are age 60 or older, your body mass index should be between 23-30. Your Body mass index is 23.98 kg/m. If this is out of the aforementioned range listed, please consider follow up with your Primary Care Provider.  If you are age 60 or younger, your body mass index should be between 19-25. Your Body mass index is 23.98 kg/m. If this is out of the aformentioned range listed, please consider follow up with your Primary Care Provider.   ________________________________________________________  The  GI providers would like to encourage you to use [facility] to communicate with providers for non-urgent requests or questions.  Due to long hold times on the telephone, sending your provider a message by [facility] may be a faster and more efficient way to get a response.  Please allow 48  business hours for a response.  Please remember that this is for non-urgent requests.  _______________________________________________________  Due to recent changes in healthcare laws, you may see the results of your imaging and laboratory studies on MyChart before your provider has had a chance to review them.  We understand that in some cases there may be results that are confusing or concerning to you. Not all laboratory results come back in the same time frame and the provider may be waiting for multiple results in order to interpret others.  Please give [place] 48 hours in order for your provider to thoroughly review all the results before contacting the office for clarification of your results.   Thank you for entrusting me with your care and for choosing [facility], Dr. Eulah Pont

## 2023-04-25 DIAGNOSIS — H2513 Age-related nuclear cataract, bilateral: Secondary | ICD-10-CM | POA: Diagnosis not present

## 2023-04-25 DIAGNOSIS — H35373 Puckering of macula, bilateral: Secondary | ICD-10-CM | POA: Diagnosis not present

## 2023-04-25 DIAGNOSIS — H5213 Myopia, bilateral: Secondary | ICD-10-CM | POA: Diagnosis not present

## 2023-05-17 ENCOUNTER — Encounter: Payer: Self-pay | Admitting: Internal Medicine

## 2023-05-26 NOTE — Progress Notes (Signed)
[facility] History and Physical   Primary Care Physician:  Deatra James, MD   Reason for Procedure:  Epigastric pain  Plan:    EGD     HPI: [name] is a 62 y.o. female seen by Cira Servant, PA-C due to epigastric pain in October.  She was referred for EGD.  Previous history of GERD and EGD in 2018 as below.  Laboratory testing in October, 04/22/2023 at gynecology with normal lipase of 26, LFTs with AST 56 otherwise normal, (ALT 30, alk phos 72, total bilirubin 0.4).  CBC at same time normal white count hemoglobin and hematocrit.  Platelets also normal at 203.  There was some eosinophilia noted with absolute eosinophils at 1.4 thousand.  She does have a history of suspected Barrett's esophagus though on follow-up exams it was not identified so that was probably an error at diagnosis.  Her father did have esophageal cancer. HPI from October visit: They report a four-week history of intermittent epigastric abdominal pain, described as an ache 'like someone's punched me in the stomach.' The pain is not constant, but occurs a couple of times a day and has woken them up at night. Eating and drinking wine sometimes alleviate the pain, but Tums and Pepcid have not been effective. They also report a hoarse voice for the past year. She states she had a similar pain when her dad died and she took xanax which provided relief. Has not tried that with this pain. She notes her body often feels physical symptoms after dealing with stress. No significant stressors at this time out of the "normal stress."   The patient has a history of stress and anxiety, and previously experienced similar symptoms after their father's death, which were thought to be stress-related. They have been trying to manage their stress through yoga and breathing exercises, and have reduced their intake of Advil from 3-4 tablets daily to about twice a week over the past four months.   Recently, they experienced an  episode of severe pain, during which they had difficulty swallowing and felt a burning sensation in their chest and stomach. They considered going to the ER, but the pain eventually subsided after using a heating pad, taking Tums, and drinking ginger tea.   Denies weight loss, melena, hematochezia.  EGD 04/13/2017: - LA Grade A reflux esophagitis. Biopsied. 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 Biopsied. - The examination was otherwise normal. 1. Surgical [P], distal esophagus - REFLUX CHANGES. - NO INTESTINAL METAPLASIA, DYSPLASIA, OR MALIGNANCY. 2. Surgical [P], mid esophagus and distal esophagus - BENIGN SQUAMOUS MUCOSA. - NO INCREASE IN INTRAEPITHELIAL EOSINOPHILS. - NO INTESTINAL METAPLASIA, DYSPLASIA, OR MALIGNANCY Past Medical History:  Diagnosis Date   Anxiety    Arm pain, left    Barrett's esophagus    Breast cancer (HCC) 45   hx   Chest discomfort    GERD (gastroesophageal reflux disease)    Hemorrhoids, internal    Hyperlipidemia    IBS (irritable bowel syndrome)    Palpitations    Personal history of radiation therapy    Schatzki's ring    hx    Past Surgical History:  Procedure Laterality Date   BREAST LUMPECTOMY Right 2007   with sentinel node dissection   COLONOSCOPY  01/2004   internal hemorrhoids   ESOPHAGOGASTRODUODENOSCOPY  01/2004, 02/2007   2006 Short Barrett's and 2008 not identified   HYSTEROSCOPY DIAGNOSTIC  04/06/2011   OVARIAN CYST REMOVAL     PORTACATH PLACEMENT  2007   removed     Prior to Admission medications   Medication Sig Start Date End Date Taking? Authorizing Provider  albuterol (PROAIR HFA) 108 (90 Base) MCG/ACT inhaler Inhale 2 puffs into the lungs every 6 (six) hours as needed for wheezing or shortness of breath. 09/14/22   Terressa Koyanagi, DO  Albuterol-Budesonide (AIRSUPRA) 90-80 MCG/ACT AERO Inhale 2 puffs into the lungs every 4 (four) hours as needed. 12/24/22   Marcelyn Bruins, MD   ALPRAZolam Prudy Feeler) 0.5 MG tablet     [provider]  Clobetasol Prop Emollient Base (CLOBETASOL PROPIONATE E) 0.05 % emollient cream APPLY TO THE AFFECTED AREAS ON THE BODY AFTER THE SHOWER TWICE DAILY 12/24/22   Marcelyn Bruins, MD  famotidine (PEPCID) 20 MG tablet Take 20 mg by mouth daily.    [provider]  ibuprofen (ADVIL,MOTRIN) 200 MG tablet Take 400 mg by mouth daily as needed for pain (takes about 3 times/week).    [provider]  loratadine (CLARITIN) 10 MG tablet     [provider]  Olopatadine-Mometasone (RYALTRIS) [phone] MCG/ACT SUSP Place 2 sprays into the nose 2 (two) times daily as needed. 12/24/22   Marcelyn Bruins, MD  RYALTRIS [phone] MCG/ACT SUSP 2 sprays each nostril 1-2 times daily as needed. 06/17/22   Marcelyn Bruins, MD    Current Outpatient Medications  Medication Sig Dispense Refill   Albuterol-Budesonide (AIRSUPRA) 90-80 MCG/ACT AERO Inhale 2 puffs into the lungs every 4 (four) hours as needed. 10.7 g 1   ALPRAZolam (XANAX) 0.5 MG tablet      Cholecalciferol 1.25 MG (50000 UT) capsule Take 50,000 Units by mouth daily.     Olopatadine-Mometasone (RYALTRIS) [id number] MCG/ACT SUSP Place 2 sprays into the nose 2 (two) times daily as needed. 29 g 5   albuterol (PROAIR HFA) 108 (90 Base) MCG/ACT inhaler Inhale 2 puffs into the lungs every 6 (six) hours as needed for wheezing or shortness of breath. 1 each 0   Clobetasol Prop Emollient Base (CLOBETASOL PROPIONATE E) 0.05 % emollient cream APPLY TO THE AFFECTED AREAS ON THE BODY AFTER THE SHOWER TWICE DAILY 60 g 3   famotidine (PEPCID) 20 MG tablet Take 20 mg by mouth daily.     ibuprofen (ADVIL,MOTRIN) 200 MG tablet Take 400 mg by mouth daily as needed for pain (takes about 3 times/week).     loratadine (CLARITIN) 10 MG tablet      RYALTRIS 665-25 MCG/ACT SUSP 2 sprays each nostril 1-2 times daily as needed. 29 g 5   Current Facility-Administered Medications   Medication Dose Route Frequency Provider Last Rate Last Admin   0.9 %  sodium chloride infusion  500 mL Intravenous Continuous Iva Boop, MD        Allergies as of 05/27/2023 - Review Complete 05/27/2023  Allergen Reaction Noted   Citalopram hydrobromide Other (See Comments) 05/14/2013   Penicillins Hives    Adhesive [tape] Rash 01/08/2013   Latex Rash 04/07/2011    Family History  Problem Relation Age of Onset   Esophageal cancer Father 60   Melanoma Maternal Aunt 60   Lung cancer Maternal Grandmother 3       hx of smoking   Breast cancer Maternal Grandmother        dx in her 64s   Cervical cancer Mother 66   Brain cancer Paternal Uncle        dx in his 45s   Colon cancer Maternal Grandfather  early 52s   Ovarian cancer Cousin        dx in her 35s   Diabetes type I Daughter    Coronary artery disease Neg Hx        No absolute premature coronary disease    Social History   Socioeconomic History   Marital status: Single    Spouse name: Not on file   Number of children: 3   Years of education: Not on file   Highest education level: Not on file  Occupational History   Occupation: Librarian, academic    Comment: [employer]  Tobacco Use   Smoking status: Former    Current packs/day: 0.00    Average packs/day: 0.5 packs/day for 11.0 years (5.5 ttl pk-yrs)    Types: Cigarettes    Start date: 07/12/1974    Quit date: 07/12/1985    Years since quitting: 37.8   Smokeless tobacco: Never   Tobacco comments:    quit 1987  Vaping Use   Vaping status: Never Used  Substance and Sexual Activity   Alcohol use: Yes    Alcohol/week: 2.0 standard drinks of alcohol    Types: 2 Glasses of wine per week    Comment: every other night    Drug use: No   Sexual activity: Not on file  Other Topics Concern   Not on file  Social History Narrative   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 She sits most of the time. She has three children.   Coffee in AM    Social Determinants of Health   Financial Resource Strain: Not on file  Food Insecurity: Not on file  Transportation Needs: Not on file  Physical Activity: Not on file  Stress: Not on file  Social Connections: Not on file  Intimate Partner Violence: Not on file    Review of Systems:  All other review of systems negative except as mentioned in the HPI.  Physical Exam: Vital signs BP 128/79   Pulse 77   Temp (!) 97.2 F (36.2 C) (Temporal)   Resp 16   Ht 5\' 4"  (1.626 m)   Wt 143 lb (64.9 kg)   LMP 03/15/2011   SpO2 98%   BMI 24.55 kg/m   General:   Alert,  Well-developed, well-nourished, pleasant and cooperative in NAD Lungs:  Clear throughout to auscultation.   Heart:  Regular rate and rhythm; no murmurs, clicks, rubs,  or gallops. Abdomen:  Soft, nontender and nondistended. Normal bowel sounds.   Neuro/Psych:  Alert and cooperative. Normal mood and affect. A and O x 3   @Amber Parrilla  Sena Slate, MD, [facility] [phone] (pager) 05/27/2023 9:45 AM@

## 2023-05-27 ENCOUNTER — Encounter: Payer: Self-pay | Admitting: Internal Medicine

## 2023-05-27 ENCOUNTER — Other Ambulatory Visit (INDEPENDENT_AMBULATORY_CARE_PROVIDER_SITE_OTHER): Payer: BC Managed Care – PPO

## 2023-05-27 ENCOUNTER — Ambulatory Visit (AMBULATORY_SURGERY_CENTER): Payer: BC Managed Care – PPO | Admitting: Internal Medicine

## 2023-05-27 VITALS — BP 112/66 | HR 68 | Temp 97.2°F | Resp 11 | Ht 64.0 in | Wt 143.0 lb

## 2023-05-27 DIAGNOSIS — R748 Abnormal levels of other serum enzymes: Secondary | ICD-10-CM

## 2023-05-27 DIAGNOSIS — R1013 Epigastric pain: Secondary | ICD-10-CM | POA: Diagnosis not present

## 2023-05-27 DIAGNOSIS — K219 Gastro-esophageal reflux disease without esophagitis: Secondary | ICD-10-CM | POA: Diagnosis not present

## 2023-05-27 LAB — HEPATIC FUNCTION PANEL
ALT: 21 U/L (ref 0–35)
AST: 39 U/L — ABNORMAL HIGH (ref 0–37)
Albumin: 4.2 g/dL (ref 3.5–5.2)
Alkaline Phosphatase: 56 U/L (ref 39–117)
Bilirubin, Direct: 0.1 mg/dL (ref 0.0–0.3)
Total Bilirubin: 0.6 mg/dL (ref 0.2–1.2)
Total Protein: 6.8 g/dL (ref 6.0–8.3)

## 2023-05-27 LAB — CK: Total CK: 50 U/L (ref 7–177)

## 2023-05-27 MED ORDER — SODIUM CHLORIDE 0.9 % IV SOLN: 500.0000 mL | INTRAVENOUS | Status: DC

## 2023-05-27 MED ORDER — OMEPRAZOLE 40 MG PO CPDR: 40.0000 mg | DELAYED_RELEASE_CAPSULE | Freq: Every day | ORAL | 0 refills | Status: AC

## 2023-05-27 NOTE — Op Note (Signed)
[facility] Patient Name: [name] Procedure Date: 05/27/2023 9:42 AM MRN: [id number] Endoscopist: Iva Boop , MD, [id number] Age: 62 Referring MD:  Date of Birth: Dec 27, 1960 Gender: Female Account #: [id number] Procedure:                Upper GI endoscopy Indications:              Epigastric abdominal pain, Suspected                            gastro-esophageal reflux disease Medicines:                Monitored Anesthesia Care Procedure:                Pre-Anesthesia Assessment:                           - Prior to the procedure, a History and Physical                            was performed, and patient medications and                            allergies were reviewed. The patient's tolerance of                            previous anesthesia was also reviewed. The risks                            and benefits of the procedure and the sedation                            options and risks were discussed with the patient.                            All questions were answered, and informed consent                            was obtained. Prior Anticoagulants: The patient has                            taken no anticoagulant or antiplatelet agents. ASA                            Grade Assessment: II - A patient with mild systemic                            disease. After reviewing the risks and benefits,                            the patient was deemed in satisfactory condition to                            undergo the procedure.  After obtaining informed consent, the endoscope was                            passed under direct vision. Throughout the                            procedure, the patient's blood pressure, pulse, and                            oxygen saturations were monitored continuously. The                            GIF [id number] #[id number] was introduced through the                            mouth, and advanced to the second  part of duodenum.                            The upper GI endoscopy was accomplished without                            difficulty. The patient tolerated the procedure                            well. Scope In: Scope Out: Findings:                 The Z-line was irregular and was found at the                            gastroesophageal junction.                           The exam was otherwise without abnormality.                           The gastroesophageal flap valve was visualized                            endoscopically and classified as Hill Grade I                            (prominent fold, tight to endoscope).                           The cardia and gastric fundus were normal on                            retroflexion. Complications:            No immediate complications. Estimated Blood Loss:     Estimated blood loss: none. Impression:               - Z-line irregular, at the gastroesophageal                            junction.                           -  The examination was otherwise normal.                           - Gastroesophageal flap valve classified as Hill                            Grade I (prominent fold, tight to endoscope).                           - No specimens collected. Recommendation:           - Patient has a contact number available for                            emergencies. The signs and symptoms of potential                            delayed complications were discussed with the                            patient. Return to normal activities tomorrow.                            Written discharge instructions were provided to the                            patient.                           - Resume previous diet.                           - 1) Omeprazole 40mg  daily to see if that helps                            GERD and hoarseness                           2) ENT referral - Dr. Irene Pap, [facility]                           3) LFT and CK level today  to evaluate recurrent                            low-grade transaminase elevation (in past                            serologies all neg), last [place] 2018 NL liver - ? if                            regular EtOH causing Iva Boop, MD 05/27/2023 10:10:53 AM This report has been signed electronically.

## 2023-05-27 NOTE — Patient Instructions (Addendum)
I did not see any problems here.  My recommendations are:  1) Try omeprazole 40 mg daily to see if that he;ps voice and other symptoms  2) We will refer to ENT specialist  3) You have mildly abnormal transaminases intermittently - these can come from liver but may come from other areas of the body. I am rechecking these today and one other test that checks for muscle inflammation.  I appreciate the opportunity to care for you. Iva Boop, MD, FACG    YOU HAD AN ENDOSCOPIC PROCEDURE TODAY AT THE [facility]:   Refer to the procedure report that was given to you for any specific questions about what was found during the examination.  If the procedure report does not answer your questions, please call your gastroenterologist to clarify.  If you requested that your care partner not be given the details of your procedure findings, then the procedure report has been included in a sealed envelope for you to review at your convenience later.  YOU SHOULD EXPECT: Some feelings of bloating in the abdomen. Passage of more gas than usual.  Walking can help get rid of the air that was put into your GI tract during the procedure and reduce the bloating. If you had a lower endoscopy (such as a colonoscopy or flexible sigmoidoscopy) you may notice spotting of blood in your stool or on the toilet paper. If you underwent a bowel prep for your procedure, you may not have a normal bowel movement for a few days.  Please Note:  You might notice some irritation and congestion in your nose or some drainage.  This is from the oxygen used during your procedure.  There is no need for concern and it should clear up in a day or so.  SYMPTOMS TO REPORT IMMEDIATELY: Following upper endoscopy (EGD)  Vomiting of blood or coffee ground material  New chest pain or pain under the shoulder blades  Painful or persistently difficult swallowing  New shortness of breath  Fever of 100F or higher  Black,  tarry-looking stools  For urgent or emergent issues, a gastroenterologist can be reached at any hour by calling [phone]. Do not use MyChart messaging for urgent concerns.    DIET:  We do recommend a small meal at first, but then you may proceed to your regular diet.  Drink plenty of fluids but you should avoid alcoholic beverages for 24 hours.  ACTIVITY:  You should plan to take it easy for the rest of today and you should NOT DRIVE or use heavy machinery until tomorrow (because of the sedation medicines used during the test).    FOLLOW UP: Our staff will call the number listed on your records the next business day following your procedure.  We will call around 7:15- 8:00 am to check on you and address any questions or concerns that you may have regarding the information given to you following your procedure. If we do not reach you, we will leave a message.     If any biopsies were taken you will be contacted by phone or by letter within the next 1-3 weeks.  Please call us at [phone] if you have not heard about the biopsies in 3 weeks.    SIGNATURES/CONFIDENTIALITY: You and/or your care partner have signed paperwork which will be entered into your electronic medical record.  These signatures attest to the fact that that the information above on your After Visit Summary has been reviewed and  is understood.  Full responsibility of the confidentiality of this discharge information lies with you and/or your care-partner.

## 2023-05-27 NOTE — Progress Notes (Signed)
Pt's states no medical or surgical changes since previsit or office visit. 

## 2023-05-30 ENCOUNTER — Telehealth: Payer: Self-pay

## 2023-05-30 ENCOUNTER — Other Ambulatory Visit: Payer: Self-pay | Admitting: Internal Medicine

## 2023-05-30 DIAGNOSIS — R748 Abnormal levels of other serum enzymes: Secondary | ICD-10-CM

## 2023-05-30 DIAGNOSIS — R490 Dysphonia: Secondary | ICD-10-CM

## 2023-05-30 NOTE — Telephone Encounter (Signed)
[name] informed that ENT referral has been made and I gave her their phone # [phone] and address: [address], [address]. To reach out to them if she doesn't hear from them in a timely manner.

## 2023-05-30 NOTE — Telephone Encounter (Signed)
  Follow up Call-     05/27/2023    9:19 AM  Call back number  Post procedure Call Back phone  # [phone]  Permission to leave phone message Yes     Patient questions:  Do you have a fever, pain , or abdominal swelling? No. Pain Score  0 *  Have you tolerated food without any problems? Yes.    Have you been able to return to your normal activities? Yes.    Do you have any questions about your discharge instructions: Diet   No. Medications  No. Follow up visit  No.  Do you have questions or concerns about your Care? No.  Actions: * If pain score is 4 or above: No action needed, pain <4.

## 2023-06-22 ENCOUNTER — Encounter (INDEPENDENT_AMBULATORY_CARE_PROVIDER_SITE_OTHER): Payer: Self-pay | Admitting: Otolaryngology

## 2023-06-23 NOTE — Telephone Encounter (Signed)
I see that ENT has tried to reach [facility] 3 times. I called and left her a detailed message with their phone # to please call them tomorrow to book an appointment.

## 2023-07-28 DIAGNOSIS — F4320 Adjustment disorder, unspecified: Secondary | ICD-10-CM | POA: Diagnosis not present

## 2023-08-09 ENCOUNTER — Telehealth: Payer: BC Managed Care – PPO | Admitting: Family Medicine

## 2023-08-09 DIAGNOSIS — J019 Acute sinusitis, unspecified: Secondary | ICD-10-CM

## 2023-08-09 DIAGNOSIS — B9689 Other specified bacterial agents as the cause of diseases classified elsewhere: Secondary | ICD-10-CM | POA: Diagnosis not present

## 2023-08-09 MED ORDER — AZITHROMYCIN 250 MG PO TABS: ORAL_TABLET | ORAL | 0 refills | Status: AC

## 2023-08-09 NOTE — Patient Instructions (Signed)
[name], thank you for joining Freddy Finner, NP for today's virtual visit.  While this provider is not your primary care provider (PCP), if your PCP is located in our provider database this encounter information will be shared with them immediately following your visit.   [facility] MyChart account gives you access to today's visit and all your visits, tests, and labs performed at [facility] " click here if you don't have a [facility] MyChart account or go to [email or web address]  Consent: (Patient) [name] provided verbal consent for this virtual visit at the beginning of the encounter.  Current Medications:  Current Outpatient Medications:    azithromycin (ZITHROMAX) 250 MG tablet, Take 2 tablets on day 1, then 1 tablet daily on days 2 through 5, Disp: 6 tablet, Rfl: 0   albuterol (PROAIR HFA) 108 (90 Base) MCG/ACT inhaler, Inhale 2 puffs into the lungs every 6 (six) hours as needed for wheezing or shortness of breath., Disp: 1 each, Rfl: 0   Albuterol-Budesonide (AIRSUPRA) 90-80 MCG/ACT AERO, Inhale 2 puffs into the lungs every 4 (four) hours as needed., Disp: 10.7 g, Rfl: 1   ALPRAZolam (XANAX) 0.5 MG tablet, , Disp: , Rfl:    Cholecalciferol 1.25 MG (50000 UT) capsule, Take 50,000 Units by mouth daily., Disp: , Rfl:    Clobetasol Prop Emollient Base (CLOBETASOL PROPIONATE E) 0.05 % emollient cream, APPLY TO THE AFFECTED AREAS ON THE BODY AFTER THE SHOWER TWICE DAILY, Disp: 60 g, Rfl: 3   famotidine (PEPCID) 20 MG tablet, Take 20 mg by mouth daily., Disp: , Rfl:    ibuprofen (ADVIL,MOTRIN) 200 MG tablet, Take 400 mg by mouth daily as needed for pain (takes about 3 times/week)., Disp: , Rfl:    loratadine (CLARITIN) 10 MG tablet, , Disp: , Rfl:    Olopatadine-Mometasone (RYALTRIS) 665-25 MCG/ACT SUSP, Place 2 sprays into the nose 2 (two) times daily as needed., Disp: 29 g, Rfl: 5   omeprazole (PRILOSEC) 40 MG capsule, Take 1 capsule (40 mg total) by  mouth daily. About 30 minutes before breakfast, Disp: 90 capsule, Rfl: 0   RYALTRIS 665-25 MCG/ACT SUSP, 2 sprays each nostril 1-2 times daily as needed., Disp: 29 g, Rfl: 5   Medications ordered in this encounter:  Meds ordered this encounter  Medications   azithromycin (ZITHROMAX) 250 MG tablet    Sig: Take 2 tablets on day 1, then 1 tablet daily on days 2 through 5    Dispense:  6 tablet    Refill:  0    Supervising Provider:   Merrilee Jansky [phone]     *If you need refills on other medications prior to your next appointment, please contact your pharmacy*  Follow-Up: Call back or seek an in-person evaluation if the symptoms worsen or if the condition fails to improve as anticipated.  [facility] [phone]  Other Instructions Take meds as prescribed -Rest -Use a cool mist humidifier especially during the winter months when heat dries out the air. - Use saline nose sprays frequently to help soothe nasal passages and promote drainage. -Saline irrigations of the nose can be very helpful if done frequently.             * 4X daily for 1 week*             * Use of a nettie pot can be helpful with this.  *Follow directions with this* *Boiled or distilled water only -stay hydrated  by drinking plenty of fluids - Keep thermostat turn down low to prevent drying out sinuses - For any cough or congestion- robitussin DM or Delsym as needed - For fever or aches or pains- take tylenol or ibuprofen as directed on bottle             * for fevers greater than 101 orally you may alternate ibuprofen and tylenol every 3 hours.   If you have been instructed to have an in-person evaluation today at a local Urgent Care facility, please use the link below. It will take you to a list of all of our available [facility], including address, phone number and hours of operation. Please do not delay care.  [facility]  If you or a family member do not have  a primary care provider, use the link below to schedule a visit and establish care. When you choose a [facility] primary care physician or advanced practice provider, you gain a long-term partner in health. Find a Primary Care Provider  Learn more about [facility] in-office and virtual care options: [facility] - Get Care Now

## 2023-08-09 NOTE — Progress Notes (Signed)
Virtual Visit Consent   [name], you are scheduled for a virtual visit with a [facility] provider today. Just as with appointments in the office, your consent must be obtained to participate. Your consent will be active for this visit and any virtual visit you may have with one of our providers in the next 365 days. If you have a MyChart account, a copy of this consent can be sent to you electronically.  As this is a virtual visit, video technology does not allow for your provider to perform a traditional examination. This may limit your provider's ability to fully assess your condition. If your provider identifies any concerns that need to be evaluated in person or the need to arrange testing (such as labs, EKG, etc.), we will make arrangements to do so. Although advances in technology are sophisticated, we cannot ensure that it will always work on either your end or our end. If the connection with a video visit is poor, the visit may have to be switched to a telephone visit. With either a video or telephone visit, we are not always able to ensure that we have a secure connection.  By engaging in this virtual visit, you consent to the provision of healthcare and authorize for your insurance to be billed (if applicable) for the services provided during this visit. Depending on your insurance coverage, you may receive a charge related to this service.  I need to obtain your verbal consent now. Are you willing to proceed with your visit today? [name] has provided verbal consent on 08/09/2023 for a virtual visit (video or telephone). Freddy Finner, NP  Date: 08/09/2023 9:49 AM  Virtual Visit via Video Note   I, Freddy Finner, connected with  [name]  ([id number], 04/16/1961) on 08/09/23 at  9:45 AM EST by a video-enabled telemedicine application and verified that I am speaking with the correct person using two identifiers.  Location: Patient: Virtual Visit Location Patient:  Home Provider: Virtual Visit Location Provider: Home Office   I discussed the limitations of evaluation and management by telemedicine and the availability of in person appointments. The patient expressed understanding and agreed to proceed.    History of Present Illness: [name] is a 63 y.o. who identifies as a female who was assigned female at birth, and is being seen today for sinus infection  Onset was a week ago with green mucus, headache, body aches, sore throat  Associated symptoms are fever now of 100.1, dark green and very thick  Modifying factors are advil for fever that helps to reduced it, nasal spray -ryaltris  Denies chest pain, shortness of breath, fevers, chills  Exposure to sick contacts- unknown COVID test: neg  Vaccines: Not UTD    Problems:  Patient Active Problem List   Diagnosis Date Noted   Situational mixed anxiety and depressive disorder 08/16/2022   Breast cancer, right breast (HCC) 05/09/2014   HYPERLIPIDEMIA 05/08/2007   HEMORRHOIDS, INTERNAL 05/08/2007   GERD 05/08/2007   Barrett's esophagus 05/08/2007   Irritable bowel syndrome 05/08/2007   BREAST CANCER, HX OF 05/08/2007    Allergies:  Allergies  Allergen Reactions   Citalopram Hydrobromide Other (See Comments)    Burning mouth   Penicillins Hives    CHILDHOOD ALLERGY   Adhesive [Tape] Rash    And blisters   Latex Rash    Rash and blisters   Medications:  Current Outpatient Medications:    albuterol (PROAIR HFA) 108 (  90 Base) MCG/ACT inhaler, Inhale 2 puffs into the lungs every 6 (six) hours as needed for wheezing or shortness of breath., Disp: 1 each, Rfl: 0   Albuterol-Budesonide (AIRSUPRA) 90-80 MCG/ACT AERO, Inhale 2 puffs into the lungs every 4 (four) hours as needed., Disp: 10.7 g, Rfl: 1   ALPRAZolam (XANAX) 0.5 MG tablet, , Disp: , Rfl:    Cholecalciferol 1.25 MG (50000 UT) capsule, Take 50,000 Units by mouth daily., Disp: , Rfl:    Clobetasol Prop Emollient Base  (CLOBETASOL PROPIONATE E) 0.05 % emollient cream, APPLY TO THE AFFECTED AREAS ON THE BODY AFTER THE SHOWER TWICE DAILY, Disp: 60 g, Rfl: 3   famotidine (PEPCID) 20 MG tablet, Take 20 mg by mouth daily., Disp: , Rfl:    ibuprofen (ADVIL,MOTRIN) 200 MG tablet, Take 400 mg by mouth daily as needed for pain (takes about 3 times/week)., Disp: , Rfl:    loratadine (CLARITIN) 10 MG tablet, , Disp: , Rfl:    Olopatadine-Mometasone (RYALTRIS) 665-25 MCG/ACT SUSP, Place 2 sprays into the nose 2 (two) times daily as needed., Disp: 29 g, Rfl: 5   omeprazole (PRILOSEC) 40 MG capsule, Take 1 capsule (40 mg total) by mouth daily. About 30 minutes before breakfast, Disp: 90 capsule, Rfl: 0   RYALTRIS 665-25 MCG/ACT SUSP, 2 sprays each nostril 1-2 times daily as needed., Disp: 29 g, Rfl: 5  Observations/Objective: Patient is well-developed, well-nourished in no acute distress.  Resting comfortably  at home.  Head is normocephalic, atraumatic.  No labored breathing.  Speech is clear and coherent with logical content.  Patient is alert and oriented at baseline.    Assessment and Plan:  1. Acute bacterial sinusitis (Primary)  - azithromycin (ZITHROMAX) 250 MG tablet; Take 2 tablets on day 1, then 1 tablet daily on days 2 through 5  Dispense: 6 tablet; Refill: 0  -Take meds as prescribed -Rest -Use a cool mist humidifier especially during the winter months when heat dries out the air. - Use saline nose sprays frequently to help soothe nasal passages and promote drainage. -Saline irrigations of the nose can be very helpful if done frequently.             * 4X daily for 1 week*             * Use of a nettie pot can be helpful with this.  *Follow directions with this* *Boiled or distilled water only -stay hydrated by drinking plenty of fluids - Keep thermostat turn down low to prevent drying out sinuses - For any cough or congestion- robitussin DM or Delsym as needed - For fever or aches or pains- take  tylenol or ibuprofen as directed on bottle             * for fevers greater than 101 orally you may alternate ibuprofen and tylenol every 3 hours.  If you do not improve you will need a follow up visit in person.                 Reviewed side effects, risks and benefits of medication.    Patient acknowledged agreement and understanding of the plan.   Past Medical, Surgical, Social History, Allergies, and Medications have been Reviewed.     Follow Up Instructions: I discussed the assessment and treatment plan with the patient. The patient was provided an opportunity to ask questions and all were answered. The patient agreed with the plan and demonstrated an understanding of the instructions.  A  copy of instructions were sent to the patient via MyChart unless otherwise noted below.    The patient was advised to call back or seek an in-person evaluation if the symptoms worsen or if the condition fails to improve as anticipated.    Freddy Finner, NP

## 2023-08-25 DIAGNOSIS — F4320 Adjustment disorder, unspecified: Secondary | ICD-10-CM | POA: Diagnosis not present

## 2023-09-09 DIAGNOSIS — F4320 Adjustment disorder, unspecified: Secondary | ICD-10-CM | POA: Diagnosis not present

## 2023-10-06 DIAGNOSIS — F4322 Adjustment disorder with anxiety: Secondary | ICD-10-CM | POA: Diagnosis not present

## 2023-10-07 DIAGNOSIS — R42 Dizziness and giddiness: Secondary | ICD-10-CM | POA: Diagnosis not present

## 2023-10-21 DIAGNOSIS — F4323 Adjustment disorder with mixed anxiety and depressed mood: Secondary | ICD-10-CM | POA: Diagnosis not present

## 2023-12-01 DIAGNOSIS — F419 Anxiety disorder, unspecified: Secondary | ICD-10-CM | POA: Diagnosis not present

## 2023-12-01 DIAGNOSIS — R42 Dizziness and giddiness: Secondary | ICD-10-CM | POA: Diagnosis not present

## 2023-12-01 DIAGNOSIS — H9313 Tinnitus, bilateral: Secondary | ICD-10-CM | POA: Diagnosis not present

## 2023-12-12 ENCOUNTER — Other Ambulatory Visit: Payer: Self-pay

## 2023-12-12 ENCOUNTER — Encounter: Payer: Self-pay | Admitting: Internal Medicine

## 2023-12-12 ENCOUNTER — Ambulatory Visit: Admitting: Internal Medicine

## 2023-12-12 VITALS — BP 122/78 | HR 66 | Temp 98.0°F | Resp 18 | Ht 64.0 in | Wt 147.5 lb

## 2023-12-12 DIAGNOSIS — K2100 Gastro-esophageal reflux disease with esophagitis, without bleeding: Secondary | ICD-10-CM

## 2023-12-12 DIAGNOSIS — L508 Other urticaria: Secondary | ICD-10-CM | POA: Diagnosis not present

## 2023-12-12 DIAGNOSIS — J4520 Mild intermittent asthma, uncomplicated: Secondary | ICD-10-CM | POA: Insufficient documentation

## 2023-12-12 DIAGNOSIS — R1112 Projectile vomiting: Secondary | ICD-10-CM

## 2023-12-12 DIAGNOSIS — Z880 Allergy status to penicillin: Secondary | ICD-10-CM | POA: Insufficient documentation

## 2023-12-12 DIAGNOSIS — J328 Other chronic sinusitis: Secondary | ICD-10-CM | POA: Diagnosis not present

## 2023-12-12 DIAGNOSIS — J301 Allergic rhinitis due to pollen: Secondary | ICD-10-CM | POA: Diagnosis not present

## 2023-12-12 DIAGNOSIS — R21 Rash and other nonspecific skin eruption: Secondary | ICD-10-CM

## 2023-12-12 DIAGNOSIS — R5383 Other fatigue: Secondary | ICD-10-CM

## 2023-12-12 MED ORDER — AIRSUPRA 90-80 MCG/ACT IN AERO: 2.0000 | INHALATION_SPRAY | RESPIRATORY_TRACT | 1 refills | Status: AC | PRN

## 2023-12-12 MED ORDER — SULFAMETHOXAZOLE-TRIMETHOPRIM 800-160 MG PO TABS: 1.0000 | ORAL_TABLET | Freq: Two times a day (BID) | ORAL | 0 refills | Status: AC

## 2023-12-12 NOTE — Patient Instructions (Addendum)
 Allergic Rhinitis - continue avoidance measures for grass and tree pollens.   - stop Clartin-D and would use antihistamine; use Claritin 10 mg daily.  - for nasal congestion/drainage symptoms use Ryaltris  nasal spray 1-2 sprays each nostril 1-2 times a day as needed. - repeat allergy  testing via bloodwork - sinus CT for chronic sinusitis and ear fullness - once CT completed, start Bactrim 1 tablet twice daily for 14 days.  Acid Reflux:  Recently had a scope. Has history of Barragetts esophagus.  -start omeprazole  40 mg daily as prescribed by GI (take on empty stomach)  Intermittent asthma - use Airsupra  2 puffs every 4 hours as needed for cough/wheeze/shortness of breath/chest tightness.  May use 15-20 minutes prior to activity.   Monitor frequency of use.   Airsupra  replaces plain Albuterol  use.  Airsupra  will provide longer lasting benefit over plain Albuterol .   H/O Penicillin allergy : - please schedule follow-up appt at your convenience for penicillin testing followed by graded oral challenge if indicated - please refrain from taking any antihistamines at least 3 days prior to this appointment  - around 80% of individuals outgrow this allergy  in ~ 10 years and carrying it as a diagnosis can prevent you from getting proper therapy if needed  Consider a sleep study given daytime fatigue. -discuss with PCP.  Rash: resolved. - continue as needed use of clobetasol  twice a day as needed to areas of the body that become dry, itchy, irritated, red, patchy, scaly, flaky, rough, bumpy etc.   Okay to reintroduce peanut.  Concern for Food allergy  (vomiting nausea) With avocado and egg plant Avoid-discussed GERd as differential Lab work today.  Follow-up in 4-6 months or sooner if needed It was a pleasure meeting you in clinic today! Thank you for allowing me to participate in your care.  Jonathon Neighbors, MD [facility]

## 2023-12-12 NOTE — Progress Notes (Signed)
 FOLLOW UP Date of Service/Encounter:  12/12/23  Subjective:  [name] (DOB: [birth year]) is a 63 y.o. female who [facility] in re-evaluation of the following: mixed rhinitis, intermittent asthma and rashes History obtained from: chart review and patient.  For Review, LV was on 12/24/23  with Dr. Tempie Fee seen for routine follow-up. See below for summary of history and diagnostics.   Therapeutic plans/changes recommended: doing well with airsupra  and ryaltris  as needed, not having rashes Previous allergy  testing borderline to 2 trees and one grass. This is my first encounter with this patient.  --------------------------------------------------- Today presents for follow-up. Discussed the use of AI scribe software for clinical note transcription with the patient, who gave verbal consent to proceed.  History of Present Illness   [name] is a 63 year old female with allergic rhinitis and asthma who presents with worsening allergy  symptoms and dizziness.  She has been experiencing severe allergy  symptoms this season, necessitating daily use of Ryaltris  nasal spray. Typically, she uses one spray daily but increases to twice daily if symptoms worsen. She experiences a scratchy voice and requires her inhaler, Airsupra , when exposed to pollen, such as when cleaning her porch.  She has a history of asthma and uses her rescue inhaler approximately three to four times a month, often upon waking with wheezing or after laughing. She has not used her inhaler in about two weeks prior to last night. She questions if reflux may contribute to her wheezing, as she has a history of Barrett's esophagus and has undergone esophageal dilation.  She experiences persistent dizziness, which has been particularly severe this spring. Vestibular testing was normal, and her ears were reportedly clear despite her perception of fluid and ringing. She describes thick  mucus production despite adequate hydration.  She has a history of rashes, which she associates with stress, particularly when her daughter and grandchildren lived with her. The rashes have mostly resolved since the stressor was removed.  She avoids peanuts due to previous allergy  testing, although she has not experienced significant symptoms upon ingestion. She reports a history of rashes and throat issues for two years, with preliminary allergy  testing showing weak results for certain allergens including peanut so she has avoided out of caution.  She experiences fatigue and poor sleep quality, feeling exhausted despite adequate sleep. No snoring or headaches, but she reports talking in her sleep. She has not been evaluated for obstructive sleep apnea.  She experiences acid reflux symptoms, although they have not been severe recently. She has not been taking any medication for reflux currently but has a history of taking Nexium  for over a year, which resolved her stomach issues.  She develops severe nausea and acid production with eggplant and avocado.    All medications reviewed by clinical staff and updated in chart. No new pertinent medical or surgical history except as noted in HPI.  ROS: All others negative except as noted per HPI.   Objective:  BP 122/78 (BP Location: Left Arm, Patient Position: Sitting, Cuff Size: Normal)   Pulse 66   Temp 98 F (36.7 C) (Temporal)   Resp 18   Ht 5\' 4"  (1.626 m)   Wt 147 lb 8 oz (66.9 kg)   LMP 03/15/2011   SpO2 96%   BMI 25.32 kg/m  Body mass index is 25.32 kg/m. Physical Exam: General Appearance:  Alert, cooperative, no distress, appears stated age  Head:  Normocephalic, without obvious abnormality, atraumatic  Eyes:  Conjunctiva clear, EOM's intact  Ears EACs normal bilaterally and normal TMs bilaterally  Nose: Nares normal, hypertrophic turbinates, normal mucosa, and no visible anterior polyps  Throat: Lips, tongue normal; teeth and  gums normal, normal posterior oropharynx  Neck: Supple, symmetrical  Lungs:   clear to auscultation bilaterally, Respirations unlabored, no coughing  Heart:  regular rate and rhythm and no murmur, Appears well perfused  Extremities: No edema  Skin: Skin color, texture, turgor normal and no rashes or lesions on visualized portions of skin  Neurologic: No gross deficits   Labs:  Lab Orders  No laboratory test(s) ordered today    Assessment/Plan   Allergic Rhinitis-not at goal - continue avoidance measures for grass and tree pollens.   - stop Clartin-D and would use antihistamine; use Claritin 10 mg daily.  - for nasal congestion/drainage symptoms use Ryaltris  nasal spray 1-2 sprays each nostril 1-2 times a day as needed. - repeat allergy  testing via bloodwork - sinus CT for chronic sinusitis and ear fullness - once CT completed, start Bactrim 1 tablet twice daily for 14 days.  Acid Reflux: not at goal Recently had a scope. Has history of Barragetts esophagus.  -start omeprazole  40 mg daily as prescribed by GI (take on empty stomach)  Intermittent asthma-not at goal - use Airsupra  2 puffs every 4 hours as needed for cough/wheeze/shortness of breath/chest tightness.  May use 15-20 minutes prior to activity.   Monitor frequency of use.   Airsupra  replaces plain Albuterol  use.  Airsupra  will provide longer lasting benefit over plain Albuterol .   H/O Penicillin allergy : - please schedule follow-up appt at your convenience for penicillin testing followed by graded oral challenge if indicated - please refrain from taking any antihistamines at least 3 days prior to this appointment  - around 80% of individuals outgrow this allergy  in ~ 10 years and carrying it as a diagnosis can prevent you from getting proper therapy if needed  Consider a sleep study given daytime fatigue. -discuss with PCP.  Rash: resolved. - continue as needed use of clobetasol  twice a day as needed to areas of the  body that become dry, itchy, irritated, red, patchy, scaly, flaky, rough, bumpy etc.   Okay to reintroduce peanut. Discussed labs less than 0.35 and she was previously tolerating. Avoidance unnecessary.  Concern for Food allergy  (vomiting nausea) With avocado and egg plant Avoid-discussed GERd as differential Lab work today.  Follow-up in 4-6 months or sooner if needed It was a pleasure meeting you in clinic today! Thank you for allowing me to participate in your care.  Other: none  Jonathon Neighbors, MD  [facility] 

## 2023-12-14 ENCOUNTER — Ambulatory Visit: Payer: Self-pay | Admitting: Internal Medicine

## 2023-12-14 LAB — ALLERGENS, ZONE 2

## 2023-12-14 NOTE — Progress Notes (Signed)
 Please let Ms. Mogg know that her environmental panel returned and is negative. Let's wait and see what her CT scan shows.

## 2023-12-15 ENCOUNTER — Telehealth: Payer: Self-pay | Admitting: Internal Medicine

## 2023-12-15 LAB — ALLERGEN AVOCADO F96: F096-IgE Avocado: 0.97 kU/L — AB

## 2023-12-15 LAB — F262-IGE EGGPLANT: F262-IgE Eggplant: <0.1 kU/L

## 2023-12-15 NOTE — Telephone Encounter (Signed)
 PT called to inquire about CT scan being ordered, I advised that [name] was out of office due to being sick and usually handled those - she acknowledged. PT also asked about Ryaltris  rx being filled, and I advised did not see on chart but would put back to clinical staff. She thanked

## 2023-12-15 NOTE — Progress Notes (Signed)
 Please let her know that her avocado has returned and is positive. Would avoid.  Eggplant is negative. If she would like to eat, we can set-up an in office challenge. Otherwise avoid due to prior history of symptoms.

## 2023-12-15 NOTE — Telephone Encounter (Signed)
 [facility]: Can you please refill Ryaltris ? It is already in system. Thanks.

## 2023-12-16 MED ORDER — RYALTRIS 665-25 MCG/ACT NA SUSP: NASAL | 5 refills | Status: AC

## 2023-12-30 ENCOUNTER — Telehealth: Payer: Self-pay | Admitting: *Deleted

## 2023-12-30 NOTE — Telephone Encounter (Signed)
 [employer] Authorization #[id number] Exp: 01/28/2024  Appt scheduled for [name] - July 2nd arriving at 4:45pm.  Patient has been informed of this appointment information. She is going to get cost information from [facility] and if it is too expensive, she will call us  to reschedule at a different imaging location.

## 2023-12-30 NOTE — Telephone Encounter (Signed)
 Patient called office this morning and was instructed to go to her scheduled CT appointment at [facility] as walk in.  When patient arrived, she was informed CT needs authorization and has to be rescheduled.  Patient has been waiting since 6/4 for CT to be scheduled.  Informed patient we will get authorization done today so CT can get rescheduled.  Patient doesn't have a preference for date and time, just needs as soon as possible as she has been waiting to know regarding starting antibiotics.  Best contact number for patient is [phone].   Opal Bill, CMA

## 2024-01-06 NOTE — Telephone Encounter (Signed)
 Melissa from [facility] called to advise that we needed to update the NPI on the Prior Auth for this PT's CT referral. Currently has NPI [id number], but it should instead be NPI [id number]. I advised would provide information to clinical staff, she thanked and asked for call back after sending to [phone] ext. [phone]

## 2024-01-11 ENCOUNTER — Ambulatory Visit (HOSPITAL_COMMUNITY)
Admission: RE | Admit: 2024-01-11 | Discharge: 2024-01-11 | Disposition: A | Discharge status: Home / Self Care | Source: Ambulatory Visit | Attending: Internal Medicine | Admitting: Internal Medicine

## 2024-01-11 DIAGNOSIS — J301 Allergic rhinitis due to pollen: Secondary | ICD-10-CM | POA: Insufficient documentation

## 2024-01-11 DIAGNOSIS — J329 Chronic sinusitis, unspecified: Secondary | ICD-10-CM | POA: Diagnosis not present

## 2024-01-11 DIAGNOSIS — J309 Allergic rhinitis, unspecified: Secondary | ICD-10-CM | POA: Diagnosis not present

## 2024-01-11 DIAGNOSIS — J342 Deviated nasal septum: Secondary | ICD-10-CM | POA: Diagnosis not present

## 2024-01-11 DIAGNOSIS — J328 Other chronic sinusitis: Secondary | ICD-10-CM | POA: Insufficient documentation

## 2024-01-11 NOTE — Telephone Encounter (Signed)
 Patient has an appointment today. The Pre-servicing office was able to correct the [employer] [employer] NPI: [id number]  / Tax ID: [id number] for future reference

## 2024-01-16 NOTE — Progress Notes (Signed)
 Please let [name] know that her CT sinus results have returned and showed significant sinus disease.   I would have her go ahead and start the Bactrim  as discussed in clinic if she hasn't already. I can also refer her to ENT based on results.    Let me know if she has any questions or concerns.

## 2024-01-18 ENCOUNTER — Telehealth: Payer: Self-pay | Admitting: *Deleted

## 2024-01-18 ENCOUNTER — Other Ambulatory Visit: Payer: Self-pay

## 2024-01-18 ENCOUNTER — Telehealth: Payer: Self-pay

## 2024-01-18 NOTE — Telephone Encounter (Signed)
 Per Dr. Marinda place referral to ENT based on sinus CT results for sinus disease.

## 2024-01-18 NOTE — Telephone Encounter (Signed)
 Yes today,

## 2024-01-18 NOTE — Telephone Encounter (Signed)
 Were you able to give her the results from the message?

## 2024-01-26 ENCOUNTER — Telehealth: Payer: Self-pay | Admitting: Internal Medicine

## 2024-01-26 NOTE — Telephone Encounter (Signed)
 [name] called and stated that she was prescribed Bactrem, and that she has taken it for one week, and has one week left. She states that this medication is keeping her from sleeping, making her super jittery, and she fells like it is making her heart race. She wants to know if Dr. Marinda can prescribe her something different. She states that she did not take it yesterday and was able to sleep and felt better. Best contact [phone]

## 2024-01-26 NOTE — Telephone Encounter (Signed)
 Is it helping at all? She can take doxycline 100 mg BID x 7 days.  Confirm no allergies to doxycycline prior to sending please.

## 2024-01-26 NOTE — Telephone Encounter (Signed)
 I spoke with the patient and she informed me that she had no relief at all with the Bactrim . Patient also informed me that she does not have any allergies to Doxycycline. It has been sent to [facility] on [address].

## 2024-01-27 NOTE — Telephone Encounter (Signed)
 [name] has an appointment with Dermatology for 03/29/24 at 11:00 am with Dr. Tobie.

## 2024-02-28 DIAGNOSIS — F4323 Adjustment disorder with mixed anxiety and depressed mood: Secondary | ICD-10-CM | POA: Diagnosis not present

## 2024-03-21 DIAGNOSIS — F4323 Adjustment disorder with mixed anxiety and depressed mood: Secondary | ICD-10-CM | POA: Diagnosis not present

## 2024-03-29 ENCOUNTER — Ambulatory Visit (INDEPENDENT_AMBULATORY_CARE_PROVIDER_SITE_OTHER): Admitting: Otolaryngology

## 2024-03-29 ENCOUNTER — Encounter (INDEPENDENT_AMBULATORY_CARE_PROVIDER_SITE_OTHER): Payer: Self-pay | Admitting: Otolaryngology

## 2024-03-29 VITALS — BP 125/85 | HR 68 | Ht 64.0 in | Wt 144.0 lb

## 2024-03-29 DIAGNOSIS — J343 Hypertrophy of nasal turbinates: Secondary | ICD-10-CM

## 2024-03-29 DIAGNOSIS — H938X3 Other specified disorders of ear, bilateral: Secondary | ICD-10-CM

## 2024-03-29 DIAGNOSIS — R0982 Postnasal drip: Secondary | ICD-10-CM

## 2024-03-29 DIAGNOSIS — M26623 Arthralgia of bilateral temporomandibular joint: Secondary | ICD-10-CM

## 2024-03-29 DIAGNOSIS — J328 Other chronic sinusitis: Secondary | ICD-10-CM | POA: Diagnosis not present

## 2024-03-29 DIAGNOSIS — J342 Deviated nasal septum: Secondary | ICD-10-CM

## 2024-03-29 DIAGNOSIS — J301 Allergic rhinitis due to pollen: Secondary | ICD-10-CM

## 2024-03-29 DIAGNOSIS — J3489 Other specified disorders of nose and nasal sinuses: Secondary | ICD-10-CM | POA: Diagnosis not present

## 2024-03-29 DIAGNOSIS — R0981 Nasal congestion: Secondary | ICD-10-CM | POA: Diagnosis not present

## 2024-03-29 MED ORDER — DOXYCYCLINE HYCLATE 100 MG PO TABS: 100.0000 mg | ORAL_TABLET | Freq: Two times a day (BID) | ORAL | 0 refills | Status: AC

## 2024-03-29 NOTE — Progress Notes (Signed)
 Dear Dr. Marinda, Here is my assessment for our mutual patient, [name]. Thank you for allowing me the opportunity to care for your patient. Please do not hesitate to contact me should you have any other questions. Sincerely, Dr. Eldora Blanch  Otolaryngology Clinic Note  HISTORY: [name] is a 63 y.o. female kindly referred by Dr. Marinda for evaluation of chronic sinusitis  Initial visit (03/2024): She reports that she has constant sinus symptoms with symptoms going on for a long time - feels like it is getting worse. Her symptoms include bilateral max pressure, ethmoid pressure, and to a lesser degree frontal sinus and very thick PND (like paste) with sometimes discolored drainage. She also reports bilateral ET symptoms and pressure behind her ears as well with some imbalance. She gets antibiotics for this for sinus infections, most recently bactrim  in the past which made her feel unwell. Antibiotics do seem to help. She has tried steroids before, but does not like taking them. Steroids did seem to help. Her symptoms recur after antibiotics and steroids.   She has also been ryaltris  which helps some.  Allergy  testing has been done. No previous sinonasal surgery.  She is currently using saline nasal spray, and ryaltris .  She has tried [employer] before and PO anthistamine (when she is feeling bad with typical AR symptoms).   GLP-1: no AP/AC: no  Tobacco: prior, quit 1980s  PMHx: GERD, AR, Asthma, MDD/GAD  RADIOGRAPHIC EVALUATION AND INDEPENDENT REVIEW OF OTHER RECORDS: Dr. Marinda (12/12/2023): mixed rhinitis, asthma. On airspura and ryaltris  (as needed). Severe allergy  symptoms currently, using ryaltris  daily. Using airsupra . Noted ears feeling full. Poor sleep, no snoring. Dx: AR - use PO anthistamine, Sinus CT, ryaltris ; GERD - start PPI; Given bactrim  01/2024. [name] 08/09/2023: discolored drainage, diagonsed with sinusitis; Rx: azithromycin , saline irrigations [name]  09/14/2022: Sinusitis with discolored drainage; Dx: Sinusitis; Rx: azithromycin  Allergy  testing (2024): pos to grasses, tree pollen RAST 12/2023: essentially negative [name] ([facility]) Vestibular eval: episodes of unsteadiness, imbalance for 30 mins to several hours, improved with PO anthistamine; almost like passing out; b/l tinntus; bilateral ear fullness. Known b/l TMJ; Testing reviewed - normal vestibular testing, A/A tymps CBC w/diff 08/12/2021: WBC 4.6, Eos 791 CT Sinus 01/11/2024 independently interpreted: left > right FSOT opacification, b/l ethmoid opacification as well as max opacification with bubbly secretions left max, left septal deviation, b/l periapical lucency but does not seem odontogenic; no mastoid or ME effusion Past Medical History:  Diagnosis Date   Anxiety    Arm pain, left    Barrett's esophagus    Breast cancer (HCC) 45   hx   Chest discomfort    GERD (gastroesophageal reflux disease)    Hemorrhoids, internal    Hyperlipidemia    IBS (irritable bowel syndrome)    Palpitations    Personal history of radiation therapy    Schatzki's ring    hx   Past Surgical History:  Procedure Laterality Date   BREAST LUMPECTOMY Right 2007   with sentinel node dissection   COLONOSCOPY  01/2004   internal hemorrhoids   ESOPHAGOGASTRODUODENOSCOPY  01/2004, 02/2007   2006 Short Barrett's and 2008 not identified   HYSTEROSCOPY DIAGNOSTIC  04/06/2011   OVARIAN CYST REMOVAL     PORTACATH PLACEMENT  2007   removed    Family History  Problem Relation Age of Onset   Esophageal cancer Father 68   Melanoma Maternal Aunt 60   Lung cancer Maternal Grandmother 45  hx of smoking   Breast cancer Maternal Grandmother        dx in her 15s   Cervical cancer Mother 76   Brain cancer Paternal Uncle        dx in his 89s   Colon cancer Maternal Grandfather        early 57s   Ovarian cancer Cousin        dx in her 49s   Diabetes type I Daughter    Coronary artery disease Neg Hx         No absolute premature coronary disease   Social History   Tobacco Use   Smoking status: Former    Current packs/day: 0.00    Average packs/day: 0.5 packs/day for 11.0 years (5.5 ttl pk-yrs)    Types: Cigarettes    Start date: 07/12/1974    Quit date: 07/12/1985    Years since quitting: 38.7   Smokeless tobacco: Never   Tobacco comments:    quit 1987  Substance Use Topics   Alcohol use: Yes    Alcohol/week: 2.0 standard drinks of alcohol    Types: 2 Glasses of wine per week    Comment: every other night    Allergies  Allergen Reactions   Citalopram Hydrobromide Other (See Comments)    Burning mouth   Penicillins Hives    CHILDHOOD ALLERGY    Adhesive [Tape] Rash    And blisters   Cefprozil Rash   Latex Rash    Rash and blisters   Current Outpatient Medications  Medication Sig Dispense Refill   albuterol  (PROAIR  HFA) 108 (90 Base) MCG/ACT inhaler Inhale 2 puffs into the lungs every 6 (six) hours as needed for wheezing or shortness of breath. 1 each 0   Albuterol -Budesonide (AIRSUPRA ) 90-80 MCG/ACT AERO Inhale 2 puffs into the lungs every 4 (four) hours as needed. 10.7 g 1   ALPRAZolam (XANAX) 0.5 MG tablet      Clobetasol  Prop Emollient Base (CLOBETASOL  PROPIONATE E) 0.05 % emollient cream APPLY TO THE AFFECTED AREAS ON THE BODY AFTER THE SHOWER TWICE DAILY 60 g 3   doxycycline  (VIBRA -TABS) 100 MG tablet Take 1 tablet (100 mg total) by mouth 2 (two) times daily for 12 days. 24 tablet 0   ibuprofen  (ADVIL ,MOTRIN ) 200 MG tablet Take 400 mg by mouth daily as needed for pain (takes about 3 times/week).     Olopatadine-Mometasone (RYALTRIS ) 665-25 MCG/ACT SUSP Place 2 sprays into the nose 2 (two) times daily as needed. 29 g 5   omeprazole  (PRILOSEC) 40 MG capsule Take 1 capsule (40 mg total) by mouth daily. About 30 minutes before breakfast 90 capsule 0   RYALTRIS  665-25 MCG/ACT SUSP 2 sprays each nostril 1-2 times daily as needed. 29 g 5   WELLBUTRIN  XL 150 MG 24 hr tablet Take 1  tablet every day by oral route in the morning for 90 days.     Cholecalciferol 1.25 MG (50000 UT) capsule Take 50,000 Units by mouth daily. (Patient not taking: Reported on 03/29/2024)     famotidine  (PEPCID ) 20 MG tablet Take 20 mg by mouth daily. (Patient not taking: Reported on 03/29/2024)     loratadine (CLARITIN) 10 MG tablet  (Patient not taking: Reported on 03/29/2024)     No current facility-administered medications for this visit.   BP 125/85 (BP Location: Right Arm, Patient Position: Sitting, Cuff Size: Large)   Pulse 68   Ht 5' 4 (1.626 m)   Wt 144 lb (65.3 kg)  LMP 03/15/2011   SpO2 95%   BMI 24.72 kg/m   PHYSICAL EXAM:  BP 125/85 (BP Location: Right Arm, Patient Position: Sitting, Cuff Size: Large)   Pulse 68   Ht 5' 4 (1.626 m)   Wt 144 lb (65.3 kg)   LMP 03/15/2011   SpO2 95%   BMI 24.72 kg/m    Salient findings:  CN II-XII intact Bilateral EAC clear and TM intact with well pneumatized middle ear spaces Nose: Anterior rhinoscopy reveals septum deviates left, bilateral inferior turbinate hypertrophy.  Nasal endoscopy was indicated to better evaluate the nose and paranasal sinuses, given the patient's history and exam findings, and is detailed below. No lesions of oral cavity/oropharynx; b/l TMJ crepitus No obviously palpable neck masses/lymphadenopathy/thyromegaly No respiratory distress or stridor   PROCEDURE:  Prior to initiating any procedures, risks/benefits/alternatives were explained to the patient and verbal consent obtained. Diagnostic Nasal Endoscopy Pre-procedure diagnosis: Concern for chronic sinusitis Post-procedure diagnosis: same Indication: See pre-procedure diagnosis and physical exam above Complications: None apparent EBL: 0 mL Anesthesia: Lidocaine 4% and topical decongestant was topically sprayed in each nasal cavity  Description of Procedure:  Patient was identified. A rigid 30 degree endoscope was utilized to evaluate the sinonasal  cavities, mucosa, sinus ostia and turbinates and septum.  Overall, signs of mucosal inflammation are noted.  No polyps, or masses noted.   Right Middle meatus: thick mucoid secretions down into NP Right SE Recess: clear Left MM: thick mucoid secretions down into NP (purulence?) Left SE Recess: clear    Photodocumentation was obtained.  CPT CODE -- 31231 - Mod 25   ASSESSMENT:  63 y.o. with:  1. Other chronic sinusitis   2. Hypertrophy of both inferior nasal turbinates   3. Nasal congestion   4. Nasal obstruction   5. Nasal septal deviation   6. Post-nasal drip   7. Ear fullness, bilateral   8. Bilateral temporomandibular joint pain   9. Seasonal allergic rhinitis due to pollen    Multiple issues today:  - Sinusitis - certainly does have exacerbations with thick mucoid rhinorrhea with possible purulence today on endo and is on medical management. We discussed possible FESS/septo to reduce severity of symptoms but she is not interested in this currently. I explained that FESS would likely not help her ear symptoms. - Ear fullness - could be due to several reasons including ETD but her tymps prior are A/A and she does not have any other otologic hx. We discussed that I would not rec BTT given tymps and CT findings. Does have TMJ which could contribute - AR: would continue medical management  PLAN: We've discussed issues and options today.  We reviewed the nasal endoscopy images together.  The risks, benefits and alternatives were discussed and questions answered.  She has elected to proceed with:  1) Will prescribe doxy BID x12d 2) Recommend continuing ryaltris  BID and PO anthistamine 3) Encouraged daily rinses 4) f/u in 4 months -- if no improvement, consider FESS as at that point she'd be medically optimized  See below regarding exact medications prescribed this encounter including dosages and route: Meds ordered this encounter  Medications   doxycycline  (VIBRA -TABS) 100 MG  tablet    Sig: Take 1 tablet (100 mg total) by mouth 2 (two) times daily for 12 days.    Dispense:  24 tablet    Refill:  0     Thank you for allowing me the opportunity to care for your patient. Please do not hesitate to  contact me should you have any other questions.  Sincerely, Eldora Blanch, MD Otolaryngologist (ENT), [facility] Phone: [phone] Fax: [phone]  I have personally spent 60 minutes involved in face-to-face and non-face-to-face activities for this patient on the day of the visit.  Professional time spent excludes any procedures performed but includes the following activities, in addition to those noted in the documentation: preparing to see the patient (review of outside documentation and results), performing a medically appropriate examination, extensive counseling for multiple problems, ordering medications (doxycycline ), documenting in the electronic health record, independently interpreting results (CT).   03/29/2024, 12:22 PM

## 2024-03-29 NOTE — Patient Instructions (Addendum)
 Continue using the ryaltris  regularly - once or twice per day Take doxycycline  100mg  tablet twice daily for 12 days.   [facility] Nasal Saline Rinse  - start nasal saline rinses with [facility] Bottle available over the counter    Nasal Saline Irrigation instructions: If you choose to make your own salt water solution, You will need: Salt (kosher, canning, or pickling salt) Baking soda Nasal irrigation bottle (i.e. [facility] Sinus Rinse) Measuring spoon ( teaspoon) Distilled / boiled water   Mix solution Mix 1 teaspoon of salt, 1/2 teaspoon of baking soda and 1 cup of water into irrigation bottle ** May use saline packet instead of homemade recipe for this step if you prefer If medicine was prescribed to be mixed with solution, place this into bottle Examples 2 inches of 2% mupirocin ointment Budesonide solution Position your head: Lean over sink (about 45 degrees) Rotate head (about 45 degrees) so that one nostril is above the other Irrigate Insert tip of irrigation bottle into upper nostril so it forms a comfortable seal Irrigate while breathing through your mouth May remove the straw from the bottle in order to irrigate the entire solution (important if medicine was added) Exhale through nose when finished and blow nose as necessary  Repeat on opposite side with other 1/2 of solution (120 mL) or remake solution if all 240 mL was used on first side Wash irrigation bottle regularly, replace every 3 months

## 2024-04-04 DIAGNOSIS — F4323 Adjustment disorder with mixed anxiety and depressed mood: Secondary | ICD-10-CM | POA: Diagnosis not present

## 2024-04-25 DIAGNOSIS — H2513 Age-related nuclear cataract, bilateral: Secondary | ICD-10-CM | POA: Diagnosis not present

## 2024-04-25 DIAGNOSIS — H52203 Unspecified astigmatism, bilateral: Secondary | ICD-10-CM | POA: Diagnosis not present

## 2024-04-25 DIAGNOSIS — H25041 Posterior subcapsular polar age-related cataract, right eye: Secondary | ICD-10-CM | POA: Diagnosis not present

## 2024-04-25 DIAGNOSIS — H5213 Myopia, bilateral: Secondary | ICD-10-CM | POA: Diagnosis not present

## 2024-04-25 DIAGNOSIS — H35373 Puckering of macula, bilateral: Secondary | ICD-10-CM | POA: Diagnosis not present

## 2024-05-02 DIAGNOSIS — F4323 Adjustment disorder with mixed anxiety and depressed mood: Secondary | ICD-10-CM | POA: Diagnosis not present

## 2024-05-09 DIAGNOSIS — Z6824 Body mass index (BMI) 24.0-24.9, adult: Secondary | ICD-10-CM | POA: Diagnosis not present

## 2024-05-09 DIAGNOSIS — Z01419 Encounter for gynecological examination (general) (routine) without abnormal findings: Secondary | ICD-10-CM | POA: Diagnosis not present

## 2024-05-09 DIAGNOSIS — Z124 Encounter for screening for malignant neoplasm of cervix: Secondary | ICD-10-CM | POA: Diagnosis not present

## 2024-05-16 DIAGNOSIS — F4323 Adjustment disorder with mixed anxiety and depressed mood: Secondary | ICD-10-CM | POA: Diagnosis not present

## 2024-05-30 DIAGNOSIS — F4323 Adjustment disorder with mixed anxiety and depressed mood: Secondary | ICD-10-CM | POA: Diagnosis not present

## 2024-06-13 DIAGNOSIS — F4323 Adjustment disorder with mixed anxiety and depressed mood: Secondary | ICD-10-CM | POA: Diagnosis not present

## 2024-08-02 ENCOUNTER — Ambulatory Visit (INDEPENDENT_AMBULATORY_CARE_PROVIDER_SITE_OTHER): Admitting: Otolaryngology
